# Patient Record
Sex: Male | Born: 1965 | Race: White | Hispanic: No | Marital: Married | Smoking: Former smoker
Health system: Southern US, Community
[De-identification: ages and names within clinical notes are randomized; demographics above are authoritative.]

## PROBLEM LIST (undated history)

## (undated) ENCOUNTER — Ambulatory Visit: Admission: EM | Source: Home / Self Care

## (undated) DIAGNOSIS — F419 Anxiety disorder, unspecified: Secondary | ICD-10-CM

## (undated) DIAGNOSIS — I10 Essential (primary) hypertension: Secondary | ICD-10-CM

## (undated) DIAGNOSIS — I2510 Atherosclerotic heart disease of native coronary artery without angina pectoris: Secondary | ICD-10-CM

## (undated) DIAGNOSIS — E785 Hyperlipidemia, unspecified: Secondary | ICD-10-CM

## (undated) DIAGNOSIS — K219 Gastro-esophageal reflux disease without esophagitis: Secondary | ICD-10-CM

## (undated) DIAGNOSIS — J45909 Unspecified asthma, uncomplicated: Secondary | ICD-10-CM

## (undated) DIAGNOSIS — T7840XA Allergy, unspecified, initial encounter: Secondary | ICD-10-CM

## (undated) HISTORY — DX: Anxiety disorder, unspecified: F41.9

## (undated) HISTORY — DX: Hyperlipidemia, unspecified: E78.5

## (undated) HISTORY — PX: UPPER GASTROINTESTINAL ENDOSCOPY: SHX188

## (undated) HISTORY — DX: Unspecified asthma, uncomplicated: J45.909

## (undated) HISTORY — DX: Essential (primary) hypertension: I10

## (undated) HISTORY — DX: Atherosclerotic heart disease of native coronary artery without angina pectoris: I25.10

## (undated) HISTORY — DX: Gastro-esophageal reflux disease without esophagitis: K21.9

## (undated) HISTORY — PX: INGUINAL HERNIA REPAIR: SUR1180

## (undated) HISTORY — DX: Allergy, unspecified, initial encounter: T78.40XA

---

## 2003-02-20 ENCOUNTER — Ambulatory Visit (HOSPITAL_COMMUNITY): Admission: RE | Admit: 2003-02-20 | Discharge: 2003-02-21 | Payer: Self-pay | Admitting: Cardiology

## 2003-02-20 HISTORY — PX: CORONARY STENT PLACEMENT: SHX1402

## 2004-12-26 ENCOUNTER — Ambulatory Visit: Payer: Self-pay | Admitting: Cardiology

## 2005-01-01 ENCOUNTER — Ambulatory Visit: Payer: Self-pay

## 2005-01-08 ENCOUNTER — Ambulatory Visit: Payer: Self-pay | Admitting: Cardiology

## 2007-01-06 ENCOUNTER — Ambulatory Visit: Payer: Self-pay | Admitting: Cardiology

## 2007-10-14 ENCOUNTER — Ambulatory Visit: Payer: Self-pay | Admitting: Cardiology

## 2007-10-18 ENCOUNTER — Ambulatory Visit: Payer: Self-pay

## 2009-11-07 ENCOUNTER — Emergency Department (HOSPITAL_COMMUNITY): Admission: EM | Admit: 2009-11-07 | Discharge: 2009-11-07 | Payer: Self-pay | Admitting: Emergency Medicine

## 2010-06-21 LAB — CBC
HCT: 49.9 % (ref 39.0–52.0)
Hemoglobin: 18.2 g/dL — ABNORMAL HIGH (ref 13.0–17.0)
MCH: 30.4 pg (ref 26.0–34.0)
MCHC: 36.5 g/dL — ABNORMAL HIGH (ref 30.0–36.0)
MCV: 83.3 fL (ref 78.0–100.0)
Platelets: 239 10*3/uL (ref 150–400)
RBC: 5.99 MIL/uL — ABNORMAL HIGH (ref 4.22–5.81)
RDW: 12.9 % (ref 11.5–15.5)
WBC: 8 10*3/uL (ref 4.0–10.5)

## 2010-06-21 LAB — DIFFERENTIAL
Basophils Absolute: 0.1 10*3/uL (ref 0.0–0.1)
Basophils Relative: 1 % (ref 0–1)
Eosinophils Absolute: 0.1 10*3/uL (ref 0.0–0.7)
Eosinophils Relative: 1 % (ref 0–5)
Lymphocytes Relative: 42 % (ref 12–46)
Lymphs Abs: 3.4 10*3/uL (ref 0.7–4.0)
Monocytes Absolute: 0.6 10*3/uL (ref 0.1–1.0)
Monocytes Relative: 8 % (ref 3–12)
Neutro Abs: 3.8 10*3/uL (ref 1.7–7.7)
Neutrophils Relative %: 48 % (ref 43–77)

## 2010-06-21 LAB — POCT CARDIAC MARKERS
CKMB, poc: 1.1 ng/mL (ref 1.0–8.0)
CKMB, poc: 1.6 ng/mL (ref 1.0–8.0)
Myoglobin, poc: 44.9 ng/mL (ref 12–200)
Myoglobin, poc: 87.5 ng/mL (ref 12–200)
Troponin i, poc: <0.05 ng/mL (ref 0.00–0.09)
Troponin i, poc: <0.05 ng/mL (ref 0.00–0.09)

## 2010-06-21 LAB — URINE MICROSCOPIC-ADD ON

## 2010-06-21 LAB — COMPREHENSIVE METABOLIC PANEL
ALT: 49 U/L (ref 0–53)
AST: 36 U/L (ref 0–37)
Albumin: 4.6 g/dL (ref 3.5–5.2)
Alkaline Phosphatase: 61 U/L (ref 39–117)
BUN: 12 mg/dL (ref 6–23)
CO2: 24 mEq/L (ref 19–32)
Calcium: 9.8 mg/dL (ref 8.4–10.5)
Chloride: 105 mEq/L (ref 96–112)
Creatinine, Ser: 0.82 mg/dL (ref 0.4–1.5)
GFR calc Af Amer: >60 mL/min (ref >60)
GFR calc non Af Amer: >60 mL/min (ref >60)
Glucose, Bld: 113 mg/dL — ABNORMAL HIGH (ref 70–99)
Potassium: 3.5 mEq/L (ref 3.5–5.1)
Sodium: 138 mEq/L (ref 135–145)
Total Bilirubin: 0.8 mg/dL (ref 0.3–1.2)
Total Protein: 7.2 g/dL (ref 6.0–8.3)

## 2010-06-21 LAB — POCT I-STAT 3, ART BLOOD GAS (G3+)
Acid-base deficit: 2 mmol/L (ref 0.0–2.0)
Bicarbonate: 22 mEq/L (ref 20.0–24.0)
O2 Saturation: 96 %
Patient temperature: 98.7
TCO2: 23 mmol/L (ref 0–100)
pCO2 arterial: 34 mmHg — ABNORMAL LOW (ref 35.0–45.0)
pH, Arterial: 7.42 (ref 7.350–7.450)
pO2, Arterial: 83 mmHg (ref 80.0–100.0)

## 2010-06-21 LAB — URINALYSIS, ROUTINE W REFLEX MICROSCOPIC
Bilirubin Urine: NEGATIVE
Glucose, UA: NEGATIVE mg/dL
Hgb urine dipstick: NEGATIVE
Ketones, ur: NEGATIVE mg/dL
Nitrite: NEGATIVE
Protein, ur: NEGATIVE mg/dL
Specific Gravity, Urine: 1.018 (ref 1.005–1.030)
Urobilinogen, UA: 1 mg/dL (ref 0.0–1.0)
pH: 6.5 (ref 5.0–8.0)

## 2010-06-21 LAB — D-DIMER, QUANTITATIVE: D-Dimer, Quant: <0.22 ug/mL-FEU (ref 0.00–0.48)

## 2010-06-21 LAB — BRAIN NATRIURETIC PEPTIDE: Pro B Natriuretic peptide (BNP): <30 pg/mL (ref 0.0–100.0)

## 2010-08-20 NOTE — Assessment & Plan Note (Signed)
[facility]                            CARDIOLOGY OFFICE NOTE   [facility], CELESTINE                      MRN:          [id number]  DATE:10/13/2007                            DOB:          03/31/1966    PRIMARY CARE PHYSICIAN:  Ok Edwards.   REASON FOR PRESENTATION:  Evaluate the patient with coronary artery  disease and dyspnea.   HISTORY OF PRESENT ILLNESS:  The patient is a pleasant 45 year old  gentleman with coronary artery disease as described below who presents  for followup.  He is actually not due to see me for until to December  2009.  However, he is having progressive shortness of breath.  He says  he really did notice this back when he was seeing me in October 2008,  but he did not really mention it.  It would be with exertion.  He will  get it with activity such as climbing up a flight of stairs or even  less.  He is anxious to start an exercise regimen and was concerned  about this before having this evaluated before starting it.  He has not  been having any resting shortness of breath.  Denies any PND or  orthopnea.  He has not been having any palpitations, presyncope, or  syncope.  He denies any chest pressure, neck or arm discomfort.  He is  getting his lipids followed at [facility] in the [facility].   PAST MEDICAL HISTORY:  Coronary artery disease (80-90% right coronary  artery stenosis status post Taxus stenting in November 2004, 25% LAD  stenosis, ostial 30% circumflex stenosis), well-preserved ejection  fraction, dyslipidemia, hernia repair at age 32, and ongoing tobacco use  with smokeless tobacco.   ALLERGIES AND INTOLERANCE:  PENICILLIN, Aspirin (he tolerates a low  dose), and PLAVIX caused rash.   MEDICATIONS:  1. Aspirin 162 mg daily.  2. Aciphex 20 mg daily.  3. Fish oil.  4. Triplex 135 mg daily.  5. Crestor 20 mg daily.   REVIEW OF SYSTEMS:  As stated in the HPI and otherwise negative for all  other systems.   PHYSICAL EXAMINATION:  GENERAL:  The patient is in no distress.  VITAL SIGNS:  Blood pressure 154/82, heart rate 88 and regular, weight  192 pounds, and body mass index 29.  HEENT:  Eyelids are unremarkable.  Pupils are equal, round, and  reactive.  Fundi not visualized.  Oral mucosa unremarkable.  NECK:  No jugular venous distention at 45 degrees.  Carotid upstroke  brisk and symmetrical.  No bruits.  No thyromegaly.  LYMPHATICS:  No cervical, axillary, or inguinal adenopathy.  LUNGS:  Clear to auscultation bilaterally.  BACK:  No costovertebral angle tenderness.  CHEST:  Unremarkable.  HEART:  PMI not displaced or sustained.  S1 and S2 within normal.  No  S3, no S4, no clicks, no rubs, no murmurs.  ABDOMEN:  Obese.  Positive bowel sounds normal in frequency and pitch.  No bruits, no rebound, no guarding, no midline pulsatile mass.  No  hepatomegaly.  No splenomegaly.  SKIN:  No rashes.  No nodules.  EXTREMITIES:  2+ pulses throughout.  No edema, no cyanosis, no clubbing.  NEURO:  Oriented to person, place, and time.  Cranial nerves II-XII  grossly intact.  Motor grossly intact.   EKG, sinus rhythm, rate 89, axis leftward, incomplete right bundle-  branch block, no acute ST-T wave changes, no change from previous EKGs.   ASSESSMENT/PLAN:  1. Shortness of breath.  The patient is having some dyspnea with      exertion.  Given his previous coronary artery disease and this      symptom which is somewhat new, the pretest probability of      obstructive coronary artery disease is somewhat moderate.      Therefore, he needs screening with perfusion study.  He would be      able to walk on a treadmill, so he will have an exercise      Cardiolite.  Provided this is negative, he can get on with an      exercise regimen and I will give him instruction on this.  He will      continue with aggressive secondary risk reduction.  2. Dyslipidemia.  He is followed in the [facility]id  Clinic in [facility] and he will continue with this with a goal LDL less than      100 and an HDL greater than 40.  3. Tobacco.  He is encouraged to stop using all tobacco products      including smokeless tobacco.  4. Weight.  The patient's body mass index puts him near and obese      range.  He and I have discussed weight loss.  He is committed to      exercise and hopefully will get down to a healthier weight.  5. Followup.  I will see him back in 6 months or sooner if needed.     Rollene Rotunda, MD, [facility]  Electronically Signed    JH/MedQ  DD: 10/13/2007  DT: 10/14/2007  Job #: [id number]   cc:   Lindaann Pascal, PA

## 2010-08-20 NOTE — Assessment & Plan Note (Signed)
[facility]                            CARDIOLOGY OFFICE NOTE   [facility], GUT                      MRN:          [id number]  DATE:01/06/2007                            DOB:          05/07/1965    PRIMARY CARE PHYSICIAN:  Lorin Picket Long.   REASON FOR PRESENTATION:  Evaluate patient with coronary disease.   HISTORY OF PRESENT ILLNESS:  The patient presents for followup. It has  been almost 2 years. He has a history of coronary disease as described  below. For a while he participated in risk reduction. However, he has  not been doing this very religiously over the last few years. He used to  walk everyday. He stopped doing this sometime ago. He still does some  activities but it is fairly minimal. With his activities of daily living  he does not get any chest discomfort, neck, or arm discomfort. He does  not get any palpitations, pre syncope, or syncope. He has no PND or  orthopnea. He has dyslipidemia and has been taking medications but has  an abnormal lipomed profile with a high level of small LDL.   PAST MEDICAL HISTORY:  Coronary artery disease (80% to 90% right  coronary artery stenosis status post TAXUS stenting in November 2004,  25% LAD stenosis, ostial 30% circumflex stenosis), well preserved  ejection fraction, dyslipidemia, hernia repair at age 21, ongoing tobacco  use (snuff).   ALLERGIES:  PENICILLIN, ASPIRIN, (HE TOLERATES A LOW DOSE ASPIRIN),  PLAVIX CAUSED A RASH.   MEDICATIONS:  1. Aspirin 162 mg daily.  2. Singulair 10 mg daily.  3. Prilosec 20 mg b.i.d.  4. Lexapro 10 mg daily.  5. Lisinopril 10 mg daily.  6. Crestor 10 mg daily.   REVIEW OF SYSTEMS:  As stated in the HPI. Positive for fatigue. However,  he wakes up at 4 a.m. to get to work. He says that some days he feels  relatively rested. This has been a chronic problem and not new.   PHYSICAL EXAMINATION:  The patient is in no distress, pleasant. Blood  pressure  140/84, heart rate 72 and regular, weight 201 pounds, body mass  index 30.  HEENT: Eyelids unremarkable, pupils equal, round, and reactive to light,  fundi not visualized, oral mucosa unremarkable.  NECK: No jugular venous distension at 45 degrees, carotid upstroke brisk  and symmetric. No bruits or thyromegaly.  LYMPHATICS: No cervical, axillary, inguinal adenopathy.  LUNGS: Clear to auscultation  bilaterally.  BACK: No costovertebral angle tenderness.  CHEST: Unremarkable.  HEART: PMI not displaced or sustained, S1 and S2 within normal limits.  No S3. No S4, clicks, rubs, or murmurs.  ABDOMEN: Obese, positive bowel sounds normal to frequency and pitch. No  bruits, rebound, guarding. No midline pulsatile mass. No hepatomegaly or  splenomegaly.  SKIN: No rashes. No nodules.  EXTREMITIES: 2+ pulses throughout. No edema. No cyanosis. No clubbing.  NEURO: Oriented to person, place, and time. Cranial nerves II-XII  grossly intact. Motor grossly intact.   EKG: Sinus rhythm, rate 56, axis within normal limits, intervals within  normal limits, no  acute ST-T wave changes.   ASSESSMENT/PLAN:  1. Coronary disease, the patient had a stress perfusion study in 2006.      He has had no change in his symptoms since then. At this point no      further cardiovascular testing is suggested. However, I did      encourage him to participate more aggressively in secondary risk      reduction.  2. Obesity, he understands the need to lose weight with exercise and      diet. I advised him on this and asked him to get back to his      walking regimen. Current recommendations are 5 to 7 days per week      from 30 minutes to 1 hour of moderate level intensity aerobic      exercise with light weight toning.  3. Tobacco, he understands that there is probably some risk to      smokeless tobacco and he should come off this completely as well.  4. Dyslipidemia, I reviewed his lipid profile at length with him. I  do      encourage him to follow up with the Pharmacist in the [facility]      of [place] for adjustment. His LDL is slightly elevated      and of the wrong size. He has had a better profile in the past. His      HDL should be above 40.  5. Follow up, we will see him back in 12 months.     Rollene Rotunda, MD, [facility]  Electronically Signed    JH/MedQ  DD: 01/06/2007  DT: 01/07/2007  Job #: [id number]   cc:   Lindaann Pascal

## 2010-08-23 NOTE — Cardiovascular Report (Signed)
   NAME:  MARE, LUDTKE NO.:  [id number]   MEDICAL RECORD NO.:  [id number]                   PATIENT TYPE:  OIB   LOCATION:  2866                                 FACILITY:  [facility]   PHYSICIAN:  Charlies Constable, M.D.                  DATE OF BIRTH:  [birth year]   DATE OF PROCEDURE:  02/20/2003  DATE OF DISCHARGE:                              CARDIAC CATHETERIZATION   CLINICAL HISTORY:  Mr. Tully is 45 years old and was recently seen by  Rollene Rotunda, M.D. for evaluation of chest pain.  He had a Cardiolite scan  which showed inferior ischemia and was studied immediately prior to this  intervention by Rollene Rotunda, M.D. and found to have a tight lesion in the  proximal right coronary artery.   PROCEDURE:  The procedure was performed via the right femoral artery through  a 6-French sheath.  We used a 6-French JR4 guiding catheter and a PT2 light  support wire.  The patient was given heparin to prolong the ACT to close to  300 seconds and was given 300 mg of Plavix.  He is allergic to aspirin.  We  were able to cross the lesion in the proximal right coronary artery with the  wire without difficulty.  We direct stented with a 3.5 x 6 mm Taxus stent  deploying this with one inflation up to 18 atmospheres for 30 seconds.  We  then post dilated with a 4.0 x 15 mm Maverick balloon performing one  inflation up to 12 atmospheres for 30 seconds.  Repeat diagnostic study was  then performed through the guiding catheter.  The patient tolerated the  procedure well and left the laboratory in satisfactory condition.   RESULTS:  Initially, the stenosis in the right coronary artery was located  at a moderate bend and was estimated at about 80%.  Following stenting this  improved to less than 10%.  Initially, we thought there might be a small  edge tear at the proximal edge of the stent but on further review we think  this was probably related to a vessel takeoff.   CONCLUSION:  Successful stenting of the lesion in the proximal right  coronary artery with a drug-eluting stent with improvement in center of  narrowing from 80% to less than 10%.   DISPOSITION:  The patient returned to postanesthesia care unit for further  observation.                                               Charlies Constable, M.D.    BB/MEDQ  D:  02/20/2003  T:  02/20/2003  Job:  [id number]   cc:   Montey Hora, P.A.  [facility]   CP Lab

## 2010-08-23 NOTE — Discharge Summary (Signed)
NAME:  Jason, [facility] NO.:  [id number]   MEDICAL RECORD NO.:  [id number]                   PATIENT TYPE:  OIB   LOCATION:  6533                                 FACILITY:  [facility]   PHYSICIAN:  Rollene Rotunda, M.D.                DATE OF BIRTH:  11-23-1965   DATE OF ADMISSION:  02/20/2003  DATE OF DISCHARGE:  02/21/2003                                 DISCHARGE SUMMARY   HOSPITAL COURSE:  [facility] is a 45 year old male patient who was sent for  primary evaluation on January 11, 2003, with chest pain and left arm  discomfort associated with shortness of breath.  Ultimately, he had a  Cardiolite scan which showed inferior ischemia and cardiac catheterization  was planned on  February 20, 2003. The catheterization revealed the  following: Left main angiographically, LAD 25% at D-1, first diagonal had an  ostial 30% lesion, circumflex angiographically normal, right coronary artery  had a proximal 90% lesion, and EF was normal at 69%.   At this point Dr. Charlies Constable intervened on the right coronary artery  lesion reducing the lesion to a less than 10% lesion post procedure. The  patient remained in the hospital overnight and was ready for discharge home  the following day.   Lab studies during patient's stay included BUN of 14, creatinine 0.8, sodium  140, potassium 4.1, hemoglobin 17.1, hematocrit 49.7, platelet count  271,000, and white count 6.6.  His EKG revealed a normal sinus rhythm with a  rate of 64 with no acute ST-T wave changes.   DISCHARGE MEDICATIONS:  Medications on discharge include Lopid 600 mg p.o.  b.i.d., fish oil, Protonix, Allegra, _______, Singulair, Plavix 75 mg one a  day (I have given him enough for six months total), sublingual nitroglycerin  p.r.n. chest pain, May use Tylenol one to two tablets q.6h. p.r.n. pain.   DISCHARGE INSTRUCTIONS:  No strenuous activity for two days and then  gradually increase activity.  He is on a  low-fat diet.  Clean the cath site  with soap and water, no scrubbing. He is to call for any questions or  concerns. He is to follow up at the [facility] office on March 08, 2003, at  11:15 a.m.      [facility], P.A. [facility]                      Rollene Rotunda, M.D.    LB/MEDQ  D:  02/21/2003  T:  02/21/2003  Job:  [id number]   cc:   Mr. Jason Chick, PA  [facility]   Rollene Rotunda, M.D.

## 2010-08-23 NOTE — Cardiovascular Report (Signed)
   NAME:  [facility], [facility] NO.:  [id number]   MEDICAL RECORD NO.:  [id number]                   PATIENT TYPE:  OIB   LOCATION:  2866                                 FACILITY:  [facility]   PHYSICIAN:  Rollene Rotunda, M.D.                DATE OF BIRTH:  January 02, 1966   DATE OF PROCEDURE:  02/20/2003  DATE OF DISCHARGE:                              CARDIAC CATHETERIZATION   PRIMARY:  Montey Hora, P.A., [facility]   PROCEDURE:  1. Left heart catheterization.  2. Coronary arteriography.   INDICATIONS:  Evaluate patient with chest pain and a Cardiolite suggesting  possibility of inferior ischemia.   PROCEDURAL NOTE:  Left heart catheterization is performed via the right  femoral artery.  The artery was cannulated using anterior wall puncture.  A  #6-French arterial sheath was inserted via the modified Seldinger technique.  Preformed Judkins and a pigtail catheter were utilized.  The patient  tolerated the procedure well and left the laboratory in stable condition.   RESULTS:  HEMODYNAMICS:  LV 129/6, AO 130/87.   CORONARIES:  The left main was normal.  The LAD had a 25% stenosis at a first diagonal.  There was 25% stenosis of the mid to distal vessel.  There was a large first  diagonal which had ostial 30% stenosis.  The circumflex had a very small  vessel in the AV groove which was normal.  There was a large obtuse marginal  which was normal.  The right coronary artery was a large, dominant vessel.  There was proximal 90% stenosis.   LEFT VENTRICULOGRAM:  The left ventriculogram was obtained in the RAO  projection.  The EF was 65% with normal wall motion.   CONCLUSION:  High grade single vessel coronary artery disease.   PLAN:  The patient will have percutaneous revascularization per Charlies Constable, M.D. and aggressive secondary risk reduction.                                               Rollene Rotunda, M.D.    JH/MEDQ  D:   02/20/2003  T:  02/20/2003  Job:  [id number]   cc:   Montey Hora, PA

## 2012-08-16 ENCOUNTER — Other Ambulatory Visit: Payer: Self-pay | Admitting: *Deleted

## 2012-08-16 MED ORDER — ROSUVASTATIN CALCIUM 40 MG PO TABS: 40.0000 mg | ORAL_TABLET | Freq: Every day | ORAL | Status: DC

## 2012-08-16 MED ORDER — OMEPRAZOLE 20 MG PO CPDR: 20.0000 mg | DELAYED_RELEASE_CAPSULE | Freq: Two times a day (BID) | ORAL | Status: DC

## 2012-08-16 NOTE — Telephone Encounter (Signed)
Patient last seen in office and had labs done on 04-20-12. Please advise. Thank you

## 2012-08-18 ENCOUNTER — Other Ambulatory Visit: Payer: Self-pay

## 2012-08-18 MED ORDER — NITROGLYCERIN 0.4 MG SL SUBL: 0.4000 mg | SUBLINGUAL_TABLET | SUBLINGUAL | Status: DC | PRN

## 2012-08-23 ENCOUNTER — Other Ambulatory Visit: Payer: Self-pay

## 2012-08-23 MED ORDER — METOPROLOL TARTRATE 25 MG PO TABS: ORAL_TABLET | ORAL | Status: DC

## 2012-10-11 ENCOUNTER — Ambulatory Visit (INDEPENDENT_AMBULATORY_CARE_PROVIDER_SITE_OTHER): Payer: PRIVATE HEALTH INSURANCE | Admitting: Family Medicine

## 2012-10-11 ENCOUNTER — Encounter: Payer: Self-pay | Admitting: Family Medicine

## 2012-10-11 VITALS — BP 154/90 | HR 88 | Temp 98.3°F | Wt 212.0 lb

## 2012-10-11 DIAGNOSIS — K219 Gastro-esophageal reflux disease without esophagitis: Secondary | ICD-10-CM

## 2012-10-11 DIAGNOSIS — I10 Essential (primary) hypertension: Secondary | ICD-10-CM

## 2012-10-11 DIAGNOSIS — I25810 Atherosclerosis of coronary artery bypass graft(s) without angina pectoris: Secondary | ICD-10-CM

## 2012-10-11 MED ORDER — RANITIDINE HCL 150 MG PO TABS: 150.0000 mg | ORAL_TABLET | Freq: Two times a day (BID) | ORAL | Status: DC

## 2012-10-11 NOTE — Patient Instructions (Signed)
Gastroesophageal Reflux Disease, Adult  Gastroesophageal reflux disease (GERD) happens when acid from your stomach flows up into the esophagus. When acid comes in contact with the esophagus, the acid causes soreness (inflammation) in the esophagus. Over time, GERD may create small holes (ulcers) in the lining of the esophagus.  CAUSES   · Increased body weight. This puts pressure on the stomach, making acid rise from the stomach into the esophagus.  · Smoking. This increases acid production in the stomach.  · Drinking alcohol. This causes decreased pressure in the lower esophageal sphincter (valve or ring of muscle between the esophagus and stomach), allowing acid from the stomach into the esophagus.  · Late evening meals and a full stomach. This increases pressure and acid production in the stomach.  · A malformed lower esophageal sphincter.  Sometimes, no cause is found.  SYMPTOMS   · Burning pain in the lower part of the mid-chest behind the breastbone and in the mid-stomach area. This may occur twice a week or more often.  · Trouble swallowing.  · Sore throat.  · Dry cough.  · Asthma-like symptoms including chest tightness, shortness of breath, or wheezing.  DIAGNOSIS   Your caregiver may be able to diagnose GERD based on your symptoms. In some cases, X-rays and other tests may be done to check for complications or to check the condition of your stomach and esophagus.  TREATMENT   Your caregiver may recommend over-the-counter or prescription medicines to help decrease acid production. Ask your caregiver before starting or adding any new medicines.   HOME CARE INSTRUCTIONS   · Change the factors that you can control. Ask your caregiver for guidance concerning weight loss, quitting smoking, and alcohol consumption.  · Avoid foods and drinks that make your symptoms worse, such as:  · Caffeine or alcoholic drinks.  · Chocolate.  · Peppermint or mint flavorings.  · Garlic and onions.  · Spicy foods.  · Citrus fruits,  such as oranges, lemons, or limes.  · Tomato-based foods such as sauce, chili, salsa, and pizza.  · Fried and fatty foods.  · Avoid lying down for the 3 hours prior to your bedtime or prior to taking a nap.  · Eat small, frequent meals instead of large meals.  · Wear loose-fitting clothing. Do not wear anything tight around your waist that causes pressure on your stomach.  · Raise the head of your bed 6 to 8 inches with wood blocks to help you sleep. Extra pillows will not help.  · Only take over-the-counter or prescription medicines for pain, discomfort, or fever as directed by your caregiver.  · Do not take aspirin, ibuprofen, or other nonsteroidal anti-inflammatory drugs (NSAIDs).  SEEK IMMEDIATE MEDICAL CARE IF:   · You have pain in your arms, neck, jaw, teeth, or back.  · Your pain increases or changes in intensity or duration.  · You develop nausea, vomiting, or sweating (diaphoresis).  · You develop shortness of breath, or you faint.  · Your vomit is green, yellow, black, or looks like coffee grounds or blood.  · Your stool is red, bloody, or black.  These symptoms could be signs of other problems, such as heart disease, gastric bleeding, or esophageal bleeding.  MAKE SURE YOU:   · Understand these instructions.  · Will watch your condition.  · Will get help right away if you are not doing well or get worse.  Document Released: 01/01/2005 Document Revised: 06/16/2011 Document Reviewed: 10/11/2010  [employer]® Patient   Information ©2014 [employer].

## 2012-10-11 NOTE — Progress Notes (Signed)
  Subjective:    Patient ID: [facility], male    DOB: November 15, 1965, 47 y.o.   MRN: [id number]  HPI This 47 y.o. male presents for evaluation of GERD.  He has hx of hiatel hernia and states he has been choked up a couple times when he was eating chocolate M&M and a piece of candy.  He states he has been taking omeprazole 20mg  po bid and he is getting some breakthrough sx's.  He has been on other PPI's and states that none work as well as omeprazole.  He has hx of CAD and coronary artery stent and sees Dr. Emeline Darling cardiology and is over due for an appointment.   Review of Systems C/o dysphasia and GERD. No chest pain, SOB, HA, dizziness, vision change, N/V, diarrhea, constipation, dysuria, urinary urgency or frequency, myalgias, arthralgias or rash.     Objective:   Physical Exam Vital signs noted  Well developed well nourished male.  HEENT - Head atraumatic Normocephalic                Eyes - PERRLA, Conjuctiva - clear Sclera- Clear EOMI                Ears - EAC's Wnl TM's Wnl Gross Hearing WNL                Nose - Nares patent                 Throat - oropharanx wnl Respiratory - Lungs CTA bilateral Cardiac - RRR S1 and S2 without murmur GI - Abdomen soft Nontender and bowel sounds active x 4 Extremities - No edema. Neuro - Grossly intact.       Assessment & Plan:  GERD (gastroesophageal reflux disease) - Plan: ranitidine (ZANTAC) 150 MG tablet bid and if still having dysphasia or GERD then will need referral to GI.  CAD (coronary artery disease) of artery bypass graft - Plan: Ambulatory referral to Cardiology.  He states he is due for an appointment with Dr. Emeline Darling Cardiology.

## 2012-10-25 ENCOUNTER — Telehealth: Payer: Self-pay | Admitting: Family Medicine

## 2012-10-25 ENCOUNTER — Ambulatory Visit (INDEPENDENT_AMBULATORY_CARE_PROVIDER_SITE_OTHER): Payer: PRIVATE HEALTH INSURANCE | Admitting: Family Medicine

## 2012-10-25 ENCOUNTER — Encounter: Payer: Self-pay | Admitting: Family Medicine

## 2012-10-25 VITALS — BP 145/85 | HR 66 | Temp 97.9°F | Wt 209.8 lb

## 2012-10-25 DIAGNOSIS — R4702 Dysphasia: Secondary | ICD-10-CM

## 2012-10-25 DIAGNOSIS — R4789 Other speech disturbances: Secondary | ICD-10-CM

## 2012-10-25 DIAGNOSIS — J441 Chronic obstructive pulmonary disease with (acute) exacerbation: Secondary | ICD-10-CM

## 2012-10-25 DIAGNOSIS — F411 Generalized anxiety disorder: Secondary | ICD-10-CM

## 2012-10-25 DIAGNOSIS — K219 Gastro-esophageal reflux disease without esophagitis: Secondary | ICD-10-CM

## 2012-10-25 MED ORDER — DEXLANSOPRAZOLE 60 MG PO CPDR: 60.0000 mg | DELAYED_RELEASE_CAPSULE | Freq: Every day | ORAL | Status: DC

## 2012-10-25 MED ORDER — BUDESONIDE-FORMOTEROL FUMARATE 160-4.5 MCG/ACT IN AERO: 2.0000 | INHALATION_SPRAY | Freq: Two times a day (BID) | RESPIRATORY_TRACT | Status: DC

## 2012-10-25 MED ORDER — ALPRAZOLAM 0.5 MG PO TABS: 0.5000 mg | ORAL_TABLET | Freq: Every evening | ORAL | Status: DC | PRN

## 2012-10-25 NOTE — Telephone Encounter (Signed)
appt given with bill oxford at 2:30

## 2012-10-25 NOTE — Progress Notes (Signed)
  Subjective:    Patient ID: [facility], male    DOB: [birth year], 47 y.o.   MRN: [id number]  HPI This 47 y.o. male presents for evaluation of GERD sx's he has been experiencing over the last few weeks. He has hx of GERD and was seen a few weeks ago c/o choking and waking up night gasping for breath and  Having difficulties with GERD sx's.  He has hx of hiatel hernia.  He has hx of CAD and has coronary artery stent. He is awaiting cardiology appointment.  He is accompanied by his wife and she states he has not been able to sleep and  Has been very anxious.  Patient states he worries about swallowing because sometimes the food or fluids do not Go down easy and he gets choked up.  He also c/o shortness of breath.  He states that ever since he has taken the Beta blocker for his CAD he has had SOB problems.  He used to be heavy smoker for years an quit a few years ago.   Review of Systems C/o dysphasia, insomnia, choking, SOB, and anxiety No chest pain, SOB, HA, dizziness, vision change, N/V, diarrhea, constipation, dysuria, urinary urgency or frequency, myalgias, arthralgias or rash.     Objective:   Physical Exam Vital signs noted  Well developed well nourished male.  HEENT - Head atraumatic Normocephalic                Eyes - PERRLA, Conjuctiva - clear Sclera- Clear EOMI                Ears - EAC's Wnl TM's Wnl Gross Hearing WNL                Nose - Nares patent                 Throat - oropharanx wnl Respiratory - Lungs CTA bilateral Cardiac - RRR S1 and S2 without murmur GI - Abdomen soft Nontender and bowel sounds active x 4 Extremities - No edema. Neuro - Grossly intact.       Assessment & Plan:  Dysphasia - Plan: Ambulatory referral to Gastroenterology, discussed DC spicy food and will recommend dexilant.  Anxiety state, unspecified - Plan: ALPRAZolam (XANAX) 0.5 MG tablet, take xanax for his insomnia and anxiety  COPD exacerbation - Plan: budesonide-formoterol  (SYMBICORT) 160-4.5 MCG/ACT inhaler, try inhaler and see if helps  GERD (gastroesophageal reflux disease) - Plan: dexlansoprazole (DEXILANT) 60 MG capsule Follow up in 2 weeks.

## 2012-10-25 NOTE — Patient Instructions (Signed)

## 2012-10-26 ENCOUNTER — Encounter: Payer: Self-pay | Admitting: Gastroenterology

## 2012-10-27 ENCOUNTER — Other Ambulatory Visit: Payer: Self-pay

## 2012-10-27 MED ORDER — METOPROLOL TARTRATE 25 MG PO TABS: ORAL_TABLET | ORAL | Status: DC

## 2012-10-27 NOTE — Telephone Encounter (Signed)
Last seen 04/20/12  MMM 

## 2012-11-08 ENCOUNTER — Ambulatory Visit: Payer: PRIVATE HEALTH INSURANCE | Admitting: Family Medicine

## 2012-11-11 ENCOUNTER — Encounter: Payer: Self-pay | Admitting: Family Medicine

## 2012-11-11 ENCOUNTER — Ambulatory Visit (INDEPENDENT_AMBULATORY_CARE_PROVIDER_SITE_OTHER): Payer: PRIVATE HEALTH INSURANCE | Admitting: Family Medicine

## 2012-11-11 VITALS — BP 132/86 | HR 78 | Ht 67.5 in | Wt 209.0 lb

## 2012-11-11 DIAGNOSIS — Z0000 Encounter for general adult medical examination without abnormal findings: Secondary | ICD-10-CM

## 2012-11-11 DIAGNOSIS — R4702 Dysphasia: Secondary | ICD-10-CM | POA: Insufficient documentation

## 2012-11-11 DIAGNOSIS — R05 Cough: Secondary | ICD-10-CM

## 2012-11-11 DIAGNOSIS — R4789 Other speech disturbances: Secondary | ICD-10-CM

## 2012-11-11 NOTE — Patient Instructions (Signed)

## 2012-11-11 NOTE — Progress Notes (Signed)
  Subjective:    Patient ID: Jason Braun, male    DOB: 1965-12-15, 47 y.o.   MRN: [id number]  HPI This 47 y.o. male presents for evaluation of dysphasia and GERD sx's.  He was seen 2 weeks ago and was having some coughing, congestion, GERD, and dysphasia. He has been taking dexilant for a week and then went to taking nexium otc.  He has  Been using a symbicort inhaler one puff bid and states he is feeling better.  He was Referred to GI for swallowing problems.  He states his swallowing is better since Doing the dexilant and nexium but he still gets choked up.   Review of Systems No chest pain, SOB, HA, dizziness, vision change, N/V, diarrhea, constipation, dysuria, urinary urgency or frequency, myalgias, arthralgias or rash.     Objective:   Physical Exam Vital signs noted  Well developed well nourished male.  HEENT - Head atraumatic Normocephalic                Eyes - PERRLA, Conjuctiva - clear Sclera- Clear EOMI                Ears - EAC's Wnl TM's Wnl Gross Hearing WNL                Nose - Nares patent                 Throat - oropharanx wnl Respiratory - Lungs CTA bilateral Cardiac - RRR S1 and S2 without murmur GI - Abdomen soft Nontender and bowel sounds active x 4 Extremities - No edema. Neuro - Grossly intact.       Assessment & Plan:  Dysphasia - Follow up with GI and continue with Nexium otc.  Cough - Dymista nasal spray one spray per nostril bid #1 sample, symbicort 160/4.5 2 puffs bid #1 sample. Get CPE labs this week fasting.  Follow up in 6 months.

## 2012-11-16 ENCOUNTER — Telehealth: Payer: Self-pay | Admitting: Family Medicine

## 2012-11-17 ENCOUNTER — Ambulatory Visit (INDEPENDENT_AMBULATORY_CARE_PROVIDER_SITE_OTHER): Payer: PRIVATE HEALTH INSURANCE | Admitting: Gastroenterology

## 2012-11-17 ENCOUNTER — Encounter: Payer: Self-pay | Admitting: Gastroenterology

## 2012-11-17 VITALS — BP 128/80 | HR 80 | Ht 66.75 in | Wt 207.2 lb

## 2012-11-17 DIAGNOSIS — R1319 Other dysphagia: Secondary | ICD-10-CM

## 2012-11-17 DIAGNOSIS — K219 Gastro-esophageal reflux disease without esophagitis: Secondary | ICD-10-CM

## 2012-11-17 NOTE — Progress Notes (Signed)
History of Present Illness: This is a 47 year old male with chronic GERD, worsening over the past several weeks. Frequent regurgitation and belching. Occasionally chokes on water and has choked on solid food. Occasional nighttime regurgitation. Changed from omeprazole 20 mg to Nexium 20 mg recently and his symptoms have improved. Denies weight loss, abdominal pain, constipation, diarrhea, change in stool caliber, melena, hematochezia, nausea, vomiting, chest pain.  Review of Systems: Pertinent positive and negative review of systems were noted in the above HPI section. All other review of systems were otherwise negative.  Current Medications, Allergies, Past Medical History, Past Surgical History, Family History and Social History were reviewed in [employer] record.  Physical Exam: General: Well developed , well nourished, no acute distress Head: Normocephalic and atraumatic Eyes:  sclerae anicteric, EOMI Ears: Normal auditory acuity Mouth: No deformity or lesions Neck: Supple, no masses or thyromegaly Lungs: Clear throughout to auscultation Heart: Regular rate and rhythm; no murmurs, rubs or bruits Abdomen: Soft, non tender and non distended. No masses, hepatosplenomegaly or hernias noted. Normal Bowel sounds Musculoskeletal: Symmetrical with no gross deformities  Skin: No lesions on visible extremities Pulses:  Normal pulses noted Extremities: No clubbing, cyanosis, edema or deformities noted Neurological: Alert oriented x 4, grossly nonfocal Cervical Nodes:  No significant cervical adenopathy Inguinal Nodes: No significant inguinal adenopathy Psychological:  Alert and cooperative. Normal mood and affect  Assessment and Recommendations:  1. GERD and dysphagia. R/O esophagitis, stricture, etc. Schedule EGD. The risks, benefits, and alternatives to endoscopy with possible biopsy and possible dilation were discussed with the patient and they consent to proceed.

## 2012-11-17 NOTE — Patient Instructions (Addendum)
You have been scheduled for an endoscopy with propofol. Please follow written instructions given to you at your visit today. If you use inhalers (even only as needed), please bring them with you on the day of your procedure. Your physician has requested that you go to www.startemmi.com and enter the access code given to you at your visit today. This web site gives a general overview about your procedure. However, you should still follow specific instructions given to you by our office regarding your preparation for the procedure.  Increase your over the counter Nexium to one tablet by mouth twice daily.   Patient advised to avoid spicy, acidic, citrus, chocolate, mints, fruit and fruit juices.  Limit the intake of caffeine, alcohol and Soda.  Don't exercise too soon after eating.  Don't lie down within 3-4 hours of eating.  Elevate the head of your bed.  Thank you for choosing me and [facility].  Venita Lick. Pleas Koch., MD., [facility]

## 2012-11-19 ENCOUNTER — Telehealth: Payer: Self-pay | Admitting: Family Medicine

## 2012-11-19 ENCOUNTER — Other Ambulatory Visit: Payer: Self-pay | Admitting: Family Medicine

## 2012-11-19 MED ORDER — AZELASTINE HCL 0.1 % NA SOLN: 1.0000 | Freq: Two times a day (BID) | NASAL | Status: DC

## 2012-11-19 NOTE — Telephone Encounter (Signed)
The Dymista you prescribed is $150, the pharmacist said they could use flonase OTC and Astelin together to equal the Dymista, needs RX for Astelin sent to pharmacy. Notify pt when this is done

## 2012-11-19 NOTE — Telephone Encounter (Signed)
astelin rx sent in

## 2012-11-19 NOTE — Telephone Encounter (Signed)
Pt aware.

## 2012-11-23 ENCOUNTER — Encounter: Payer: Self-pay | Admitting: Gastroenterology

## 2012-11-23 NOTE — Telephone Encounter (Signed)
CALLED PT BECAUSE NO RECORD OF TAKING FLONASE IN CHART. PT SAID HE HASNT HAD A RX FOR FLONASE BUT WANTED TO TRY IT. PT DECIDED TODAY HE DIDN'T NEED RX.

## 2012-11-25 ENCOUNTER — Ambulatory Visit (AMBULATORY_SURGERY_CENTER): Payer: PRIVATE HEALTH INSURANCE | Admitting: Gastroenterology

## 2012-11-25 ENCOUNTER — Encounter: Payer: Self-pay | Admitting: Gastroenterology

## 2012-11-25 VITALS — BP 123/79 | HR 67 | Temp 97.9°F | Resp 53 | Ht 66.0 in | Wt 207.0 lb

## 2012-11-25 DIAGNOSIS — K2970 Gastritis, unspecified, without bleeding: Secondary | ICD-10-CM

## 2012-11-25 DIAGNOSIS — K2990 Gastroduodenitis, unspecified, without bleeding: Secondary | ICD-10-CM

## 2012-11-25 DIAGNOSIS — K219 Gastro-esophageal reflux disease without esophagitis: Secondary | ICD-10-CM

## 2012-11-25 DIAGNOSIS — R4702 Dysphasia: Secondary | ICD-10-CM

## 2012-11-25 DIAGNOSIS — R1319 Other dysphagia: Secondary | ICD-10-CM

## 2012-11-25 DIAGNOSIS — R4789 Other speech disturbances: Secondary | ICD-10-CM

## 2012-11-25 MED ORDER — SODIUM CHLORIDE 0.9 % IV SOLN: 500.0000 mL | INTRAVENOUS | Status: DC

## 2012-11-25 NOTE — Progress Notes (Signed)
Report to pacu rn, vvs, bbs=clear

## 2012-11-25 NOTE — Progress Notes (Signed)
Called to room to assist during endoscopic procedure.  Patient ID and intended procedure confirmed with present staff. Received instructions for my participation in the procedure from the performing physician.  

## 2012-11-25 NOTE — Op Note (Signed)
[facility] [address] [place], [address]   ENDOSCOPY PROCEDURE REPORT  PATIENT: Jason, Braun  MR#: [id number] BIRTHDATE: 12-21-1965 , 47  yrs. old GENDER: Male ENDOSCOPIST: Meryl Dare, MD, [facility] REFERRED BY:  Rudi Heap, M.D. PROCEDURE DATE:  11/25/2012 PROCEDURE:  EGD w/ biopsy and Savary dilation of esophagus ASA CLASS:     Class II INDICATIONS:  Dysphagia.   History of esophageal reflux. MEDICATIONS: MAC sedation, administered by CRNA and propofol (Diprivan) 150mg  IV TOPICAL ANESTHETIC: none DESCRIPTION OF PROCEDURE: After the risks benefits and alternatives of the procedure were thoroughly explained, informed consent was obtained.  The LB [id number] endoscope was introduced through the mouth and advanced to the second portion of the duodenum  without limitations.  The instrument was slowly withdrawn as the mucosa was fully examined.  STOMACH: Mild gastritis was found in the gastric antrum.  Multiple biopsies were performed. The stomach otherwise appeared normal. ESOPHAGUS: The mucosa of the esophagus appeared normal. DUODENUM: The duodenal mucosa showed no abnormalities in the bulb and second portion of the duodenum.  Retroflexed views revealed no abnormalities.  A guidewire was placed and the scope was then withdrawn from the patient. A 17 mm Savary dilator was passed with no resistance and no heme for dysphagia without a stricture. The guidewire and dilator were removed from the patient and the procedure completed.  COMPLICATIONS: There were no complications.  ENDOSCOPIC IMPRESSION: 1.   Gastritis in the gastric antrum; multiple biopsies 2.   The EGD otherwise appeared normal  RECOMMENDATIONS: 1.  Anti-reflux regimen long term 2.  Continue PPI long term 3.  Await pathology results 4.  Post dilation instructions   eSigned:  Meryl Dare, MD, [facility] 11/25/2012 11:25 AM

## 2012-11-25 NOTE — Patient Instructions (Addendum)
FOLLOW DISCHARGE INSTRUCTIONS (BLUE AND GREEN SHEETS).YOU HAD AN ENDOSCOPIC PROCEDURE TODAY AT THE [facility]: Refer to the procedure report that was given to you for any specific questions about what was found during the examination.  If the procedure report does not answer your questions, please call your gastroenterologist to clarify.  If you requested that your care partner not be given the details of your procedure findings, then the procedure report has been included in a sealed envelope for you to review at your convenience later.  YOU SHOULD EXPECT: Some feelings of bloating in the abdomen. Passage of more gas than usual.  Walking can help get rid of the air that was put into your GI tract during the procedure and reduce the bloating. If you had a lower endoscopy (such as a colonoscopy or flexible sigmoidoscopy) you may notice spotting of blood in your stool or on the toilet paper. If you underwent a bowel prep for your procedure, then you may not have a normal bowel movement for a few days.  DIET: Your first meal following the procedure should be a light meal and then it is ok to progress to your normal diet.  A half-sandwich or bowl of soup is an example of a good first meal.  Heavy or fried foods are harder to digest and may make you feel nauseous or bloated.  Likewise meals heavy in dairy and vegetables can cause extra gas to form and this can also increase the bloating.  Drink plenty of fluids but you should avoid alcoholic beverages for 24 hours.  ACTIVITY: Your care partner should take you home directly after the procedure.  You should plan to take it easy, moving slowly for the rest of the day.  You can resume normal activity the day after the procedure however you should NOT DRIVE or use heavy machinery for 24 hours (because of the sedation medicines used during the test).    SYMPTOMS TO REPORT IMMEDIATELY: A gastroenterologist can be reached at any hour.  During normal  business hours, 8:30 AM to 5:00 PM Monday through Friday, call [phone].  After hours and on weekends, please call the GI answering service at [phone] who will take a message and have the physician on call contact you.    Following upper endoscopy (EGD)  Vomiting of blood or coffee ground material  New chest pain or pain under the shoulder blades  Painful or persistently difficult swallowing  New shortness of breath  Fever of 100F or higher  Black, tarry-looking stools  FOLLOW UP: If any biopsies were taken you will be contacted by phone or by letter within the next 1-3 weeks.  Call your gastroenterologist if you have not heard about the biopsies in 3 weeks.  Our staff will call the home number listed on your records the next business day following your procedure to check on you and address any questions or concerns that you may have at that time regarding the information given to you following your procedure. This is a courtesy call and so if there is no answer at the home number and we have not heard from you through the emergency physician on call, we will assume that you have returned to your regular daily activities without incident.  SIGNATURES/CONFIDENTIALITY: You and/or your care partner have signed paperwork which will be entered into your electronic medical record.  These signatures attest to the fact that that the information above on your After Visit Summary has been  reviewed and is understood.  Full responsibility of the confidentiality of this discharge information lies with you and/or your care-partner.   Gastrititis information given.  Await biopsy results.    Continue PPI  Post dilation information given.

## 2012-11-25 NOTE — Progress Notes (Signed)
Patient did not experience any of the following events: a burn prior to discharge; a fall within the facility; wrong site/side/patient/procedure/implant event; or a hospital transfer or hospital admission upon discharge from the facility. (G8907) Patient did not have preoperative order for IV antibiotic SSI prophylaxis. (G8918)  

## 2012-11-26 ENCOUNTER — Telehealth: Payer: Self-pay | Admitting: *Deleted

## 2012-11-26 NOTE — Telephone Encounter (Signed)
  Follow up Call-  Call back number 11/25/2012  Post procedure Call Back phone  # [phone]  Permission to leave phone message Yes     Patient questions:  Do you have a fever, pain , or abdominal swelling? no Pain Score  0 *  Have you tolerated food without any problems? yes  Have you been able to return to your normal activities? yes  Do you have any questions about your discharge instructions: Diet   no Medications  no Follow up visit  no  Do you have questions or concerns about your Care? no  Actions: * If pain score is 4 or above: No action needed, pain <4.

## 2012-11-30 ENCOUNTER — Encounter: Payer: Self-pay | Admitting: Gastroenterology

## 2012-12-01 ENCOUNTER — Other Ambulatory Visit: Payer: Self-pay

## 2012-12-01 MED ORDER — METOPROLOL TARTRATE 25 MG PO TABS: ORAL_TABLET | ORAL | Status: DC

## 2012-12-01 NOTE — Telephone Encounter (Signed)
Last seen 04/20/12  MMM 

## 2012-12-15 ENCOUNTER — Telehealth: Payer: Self-pay | Admitting: Family Medicine

## 2012-12-16 ENCOUNTER — Other Ambulatory Visit: Payer: Self-pay | Admitting: Family Medicine

## 2012-12-16 DIAGNOSIS — J441 Chronic obstructive pulmonary disease with (acute) exacerbation: Secondary | ICD-10-CM

## 2012-12-16 MED ORDER — BUDESONIDE-FORMOTEROL FUMARATE 160-4.5 MCG/ACT IN AERO: 2.0000 | INHALATION_SPRAY | Freq: Two times a day (BID) | RESPIRATORY_TRACT | Status: DC

## 2012-12-16 MED ORDER — AZELASTINE-FLUTICASONE 137-50 MCG/ACT NA SUSP: 2.0000 | Freq: Two times a day (BID) | NASAL | Status: DC

## 2012-12-16 NOTE — Telephone Encounter (Signed)
Please advise 

## 2012-12-16 NOTE — Telephone Encounter (Signed)
rx for dymista and symbicort sent to pharm

## 2012-12-20 NOTE — Telephone Encounter (Signed)
Pt went to ED since he had not heard from our office. Wife was very distressed.

## 2012-12-23 ENCOUNTER — Ambulatory Visit (INDEPENDENT_AMBULATORY_CARE_PROVIDER_SITE_OTHER): Payer: PRIVATE HEALTH INSURANCE | Admitting: Cardiology

## 2012-12-23 ENCOUNTER — Encounter: Payer: Self-pay | Admitting: Cardiology

## 2012-12-23 VITALS — BP 143/93 | HR 82 | Ht 66.0 in | Wt 200.8 lb

## 2012-12-23 DIAGNOSIS — I25810 Atherosclerosis of coronary artery bypass graft(s) without angina pectoris: Secondary | ICD-10-CM

## 2012-12-23 NOTE — Progress Notes (Signed)
HPI The patient presents for followup of known coronary disease. He has been some years since he was last seen. He has a history of coronary disease as described below. He does report having a stress perfusion study about 3 years ago in [place].  He says his back for routine followup. He does get some dyspnea. He gets this with activities. He says he's noticed this more recently when he tries to do his walking. He has been told he might have some asthma related to reflux. He's also recently been treated with some antibiotics and thinks he might be getting better. He is not describing any PND or orthopnea. He is not describing any palpitations, presyncope or syncope. Dyspnea was his previous angina. He never had neck or arm pain.  Allergies  Allergen Reactions  . Plavix [Clopidogrel Bisulfate] Shortness Of Breath  . Trilipix [Choline Fenofibrate] Shortness Of Breath    Patient stating it made his tongue swell  . Penicillins     Current Outpatient Prescriptions  Medication Sig Dispense Refill  . ALPRAZolam (XANAX) 0.5 MG tablet Take 1 tablet (0.5 mg total) by mouth at bedtime as needed for sleep.  30 tablet  1  . AMBULATORY NON FORMULARY MEDICATION L-Argen 500 mg Take 1 capsule by mouth once daily      . aspirin 81 MG tablet Take 81 mg by mouth daily.      Marland Kitchen azelastine (ASTELIN) 137 MCG/SPRAY nasal spray Place 1 spray into the nose 2 (two) times daily. Use in each nostril as directed  30 mL  12  . Azelastine-Fluticasone (DYMISTA) 137-50 MCG/ACT SUSP Place 2 sprays into the nose 2 (two) times daily.  1 Bottle  3  . budesonide-formoterol (SYMBICORT) 160-4.5 MCG/ACT inhaler Inhale 2 puffs into the lungs 2 (two) times daily.  1 Inhaler  3  . cetirizine-pseudoephedrine (ZYRTEC-D) 5-120 MG per tablet Take 1 tablet by mouth as needed for allergies.      Marland Kitchen esomeprazole (NEXIUM 24HR) 20 MG capsule Take 20 mg by mouth daily.      . metoprolol tartrate (LOPRESSOR) 25 MG tablet Take 12.5 mg by mouth 2  (two) times daily.       . nitroGLYCERIN (NITROSTAT) 0.4 MG SL tablet Place 1 tablet (0.4 mg total) under the tongue every 5 (five) minutes as needed for chest pain.  25 tablet  1  . rosuvastatin (CRESTOR) 40 MG tablet Take 20 mg by mouth daily.      Marland Kitchen triamcinolone (NASACORT) 55 MCG/ACT nasal inhaler Place 2 sprays into the nose daily.       No current facility-administered medications for this visit.    Past Medical History  Diagnosis Date  . Hyperlipidemia   . GERD (gastroesophageal reflux disease)   . Anxiety   . Asthma   . CAD (coronary artery disease)   . Pneumonia     Past Surgical History  Procedure Laterality Date  . Coronary stent placement  02/20/2003  . Inguinal hernia repair      as a child    Family History  Problem Relation Age of Onset  . Heart attack Father   . Diabetes Maternal Grandmother   . Heart disease Paternal Uncle   . Kidney disease Maternal Aunt     History   Social History  . Marital Status: Married    Spouse Name: N/A    Number of Children: 1  . 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 Smoking status: Former Smoker    Types: Cigarettes    Quit date: 10/11/2001  . Smokeless tobacco: Former Neurosurgeon    Types: Snuff    Quit date: 05/14/2012  . Alcohol Use: Yes     Comment: occasional  . Drug Use: No  . Sexual Activity: Not on file   Other Topics Concern  . Not on file   Social History Narrative  . No narrative on file    ROS:  Positive for hiatal hernia, and joint pains.  As stated in the HPI and negative for all other systems.   PHYSICAL EXAM BP 143/93  Pulse 82  Ht 5\' 6"  (1.676 m)  Wt 200 lb 12.8 oz (91.082 kg)  BMI 32.43 kg/m2 GENERAL:  Well appearing HEENT:  Pupils equal round and reactive, fundi not visualized, oral mucosa unremarkable NECK:  No jugular venous distention, waveform within normal limits, carotid upstroke brisk and symmetric, no bruits, no  thyromegaly LYMPHATICS:  No cervical, inguinal adenopathy LUNGS:  Clear to auscultation bilaterally BACK:  No CVA tenderness CHEST:  Unremarkable HEART:  PMI not displaced or sustained,S1 and S2 within normal limits, no S3, no S4, no clicks, no rubs, no murmurs ABD:  Flat, positive bowel sounds normal in frequency in pitch, no bruits, no rebound, no guarding, no midline pulsatile mass, no hepatomegaly, no splenomegaly EXT:  2 plus pulses throughout, no edema, no cyanosis no clubbing SKIN:  No rashes no nodules NEURO:  Cranial nerves II through XII grossly intact, motor grossly intact throughout PSYCH:  Cognitively intact, oriented to person place and time   EKG:  Sinus rhythm, rate 82, right bundle branch block incomplete, no acute ST-T wave changes.  ASSESSMENT AND PLAN  CAD:  Given this history and his ongoing dyspnea he will need a stress test. He does not think he would be a walk on a treadmill. Therefore, he will have a [employer].  HTN:   His blood pressure is mildly elevated. However, we are going to concentrate on weight loss to try to manage this.  DYSLIPIDEMIA:  This is followed by his primary provider. No change in therapy is indicated.

## 2012-12-23 NOTE — Patient Instructions (Addendum)
The current medical regimen is effective;  continue present plan and medications.  Your physician has requested that you have a lexiscan myoview. For further information please visit [email or web address]. Please follow instruction sheet, as given.  Follow up in 1 year with Dr Hochrein.  You will receive a letter in the mail 2 months before you are due.  Please call us when you receive this letter to schedule your follow up appointment.  

## 2012-12-27 ENCOUNTER — Other Ambulatory Visit: Payer: Self-pay | Admitting: Nurse Practitioner

## 2012-12-27 ENCOUNTER — Other Ambulatory Visit: Payer: Self-pay

## 2012-12-27 NOTE — Telephone Encounter (Signed)
Patient wanted xanax refill- not in epic that he is taking- please check chart

## 2012-12-27 NOTE — Telephone Encounter (Signed)
Last seen 04/20/12  MMM   If approved route to nurse to phone into [facility]  [phone]

## 2012-12-28 ENCOUNTER — Other Ambulatory Visit (INDEPENDENT_AMBULATORY_CARE_PROVIDER_SITE_OTHER): Payer: PRIVATE HEALTH INSURANCE

## 2012-12-28 DIAGNOSIS — R5383 Other fatigue: Secondary | ICD-10-CM

## 2012-12-28 DIAGNOSIS — R5381 Other malaise: Secondary | ICD-10-CM

## 2012-12-28 DIAGNOSIS — I10 Essential (primary) hypertension: Secondary | ICD-10-CM

## 2012-12-28 DIAGNOSIS — Z125 Encounter for screening for malignant neoplasm of prostate: Secondary | ICD-10-CM

## 2012-12-28 DIAGNOSIS — E785 Hyperlipidemia, unspecified: Secondary | ICD-10-CM

## 2012-12-28 LAB — POCT CBC
Granulocyte percent: 54 %G (ref 37–80)
HCT, POC: 48.7 % (ref 43.5–53.7)
Hemoglobin: 16.9 g/dL (ref 14.1–18.1)
Lymph, poc: 3 (ref 0.6–3.4)
MCH, POC: 29.5 pg (ref 27–31.2)
MCHC: 34.7 g/dL (ref 31.8–35.4)
MCV: 85.1 fL (ref 80–97)
MPV: 8.5 fL (ref 0–99.8)
POC Granulocyte: 3.8 (ref 2–6.9)
POC LYMPH PERCENT: 42.9 %L (ref 10–50)
Platelet Count, POC: 242 10*3/uL (ref 142–424)
RBC: 5.7 M/uL (ref 4.69–6.13)
RDW, POC: 13 %
WBC: 7 10*3/uL (ref 4.6–10.2)

## 2012-12-28 NOTE — Telephone Encounter (Signed)
Patient needs to be seen fo xanax- not in chart or in epic

## 2012-12-28 NOTE — Progress Notes (Signed)
Patient came in for labs only.

## 2012-12-28 NOTE — Telephone Encounter (Signed)
Chart on desk

## 2012-12-29 LAB — CMP14+EGFR
ALT: 30 IU/L (ref 0–44)
AST: 27 IU/L (ref 0–40)
Albumin/Globulin Ratio: 2.1 (ref 1.1–2.5)
Albumin: 4.4 g/dL (ref 3.5–5.5)
Alkaline Phosphatase: 84 IU/L (ref 39–117)
BUN/Creatinine Ratio: 11 (ref 9–20)
BUN: 8 mg/dL (ref 6–24)
CO2: 27 mmol/L (ref 18–29)
Calcium: 9.7 mg/dL (ref 8.7–10.2)
Chloride: 102 mmol/L (ref 97–108)
Creatinine, Ser: 0.73 mg/dL — ABNORMAL LOW (ref 0.76–1.27)
GFR calc Af Amer: 128 mL/min/{1.73_m2} (ref >59)
GFR calc non Af Amer: 110 mL/min/{1.73_m2} (ref >59)
Globulin, Total: 2.1 g/dL (ref 1.5–4.5)
Glucose: 96 mg/dL (ref 65–99)
Potassium: 4.5 mmol/L (ref 3.5–5.2)
Sodium: 144 mmol/L (ref 134–144)
Total Bilirubin: 0.3 mg/dL (ref 0.0–1.2)
Total Protein: 6.5 g/dL (ref 6.0–8.5)

## 2012-12-29 LAB — LIPID PANEL
Chol/HDL Ratio: 3.5 ratio units (ref 0.0–5.0)
Cholesterol, Total: 105 mg/dL (ref 100–199)
HDL: 30 mg/dL — ABNORMAL LOW (ref >39)
LDL Calculated: 42 mg/dL (ref 0–99)
Triglycerides: 165 mg/dL — ABNORMAL HIGH (ref 0–149)
VLDL Cholesterol Cal: 33 mg/dL (ref 5–40)

## 2012-12-29 LAB — PSA, TOTAL AND FREE
PSA, Free Pct: 18.2 %
PSA, Free: 0.31 ng/mL
PSA: 1.7 ng/mL (ref 0.0–4.0)

## 2012-12-29 LAB — TSH: TSH: 0.882 u[IU]/mL (ref 0.450–4.500)

## 2012-12-31 ENCOUNTER — Telehealth: Payer: Self-pay | Admitting: *Deleted

## 2012-12-31 ENCOUNTER — Other Ambulatory Visit: Payer: Self-pay

## 2012-12-31 ENCOUNTER — Other Ambulatory Visit: Payer: Self-pay | Admitting: Family Medicine

## 2012-12-31 DIAGNOSIS — F411 Generalized anxiety disorder: Secondary | ICD-10-CM

## 2012-12-31 MED ORDER — ALPRAZOLAM 0.5 MG PO TABS: 0.5000 mg | ORAL_TABLET | Freq: Every evening | ORAL | Status: DC | PRN

## 2012-12-31 NOTE — Telephone Encounter (Signed)
Please call in rx for xanax with 1 refill 

## 2012-12-31 NOTE — Telephone Encounter (Signed)
Last seen 04/20/12 MMM  If approved route to nurse to call into [facility]  [phone]

## 2012-12-31 NOTE — Telephone Encounter (Signed)
Patient last seen in office on 11-11-12. Rx last filled on 11-29-12 for #30. Please advise. If approved please route to Pool A so nurse can call in to [facility] pharmacy at [phone]

## 2013-01-03 ENCOUNTER — Other Ambulatory Visit: Payer: Self-pay

## 2013-01-03 NOTE — Telephone Encounter (Signed)
Called in.

## 2013-01-03 NOTE — Telephone Encounter (Signed)
Last seen 04/20/12  MMM

## 2013-01-05 ENCOUNTER — Encounter: Payer: Self-pay | Admitting: Cardiology

## 2013-01-06 ENCOUNTER — Ambulatory Visit (HOSPITAL_COMMUNITY): Payer: No Typology Code available for payment source | Attending: Cardiology | Admitting: Radiology

## 2013-01-06 ENCOUNTER — Other Ambulatory Visit: Payer: Self-pay | Admitting: *Deleted

## 2013-01-06 VITALS — BP 131/87 | HR 72 | Ht 66.0 in | Wt 200.0 lb

## 2013-01-06 DIAGNOSIS — R079 Chest pain, unspecified: Secondary | ICD-10-CM

## 2013-01-06 DIAGNOSIS — I25810 Atherosclerosis of coronary artery bypass graft(s) without angina pectoris: Secondary | ICD-10-CM | POA: Insufficient documentation

## 2013-01-06 DIAGNOSIS — I2510 Atherosclerotic heart disease of native coronary artery without angina pectoris: Secondary | ICD-10-CM

## 2013-01-06 DIAGNOSIS — R0602 Shortness of breath: Secondary | ICD-10-CM

## 2013-01-06 MED ORDER — REGADENOSON 0.4 MG/5ML IV SOLN
0.4000 mg | Freq: Once | INTRAVENOUS | Status: AC
  Administered 2013-01-06: 0.4 mg via INTRAVENOUS

## 2013-01-06 MED ORDER — METOPROLOL TARTRATE 25 MG PO TABS: ORAL_TABLET | ORAL | Status: DC

## 2013-01-06 MED ORDER — TECHNETIUM TC 99M SESTAMIBI GENERIC - CARDIOLITE
11.0000 | Freq: Once | INTRAVENOUS | Status: AC | PRN
  Administered 2013-01-06: 11 via INTRAVENOUS

## 2013-01-06 MED ORDER — TECHNETIUM TC 99M SESTAMIBI GENERIC - CARDIOLITE
33.0000 | Freq: Once | INTRAVENOUS | Status: AC | PRN
  Administered 2013-01-06: 33 via INTRAVENOUS

## 2013-01-06 NOTE — Telephone Encounter (Signed)
NTBS.

## 2013-01-06 NOTE — Progress Notes (Signed)
[facility] SITE 3 NUCLEAR MED [address] [place], [place] [address] [phone]    Cardiology Nuclear Med Study  Jason Braun is a 47 y.o. male     MRN : [id number]     DOB: 1966-01-11  Procedure Date: 01/06/2013  Nuclear Med Background Indication for Stress Test:  Evaluation for Ischemia and Stent Patency History: Catheterization> stent of RCA, EF 65%, residual non-obstructive CAD, 07/09 Myocardial Perfusion Study: mild inferior ischemia, possible attenuation, more prominent from previous scan, EF 63%. Cardiac Risk Factors: Family History - CAD, History of Smoking, Hypertension and Lipids  Symptoms:  Chest Tightness with Exertion (last date of chest discomfort was one month ago), Diaphoresis, DOE, Fatigue, Fatigue with Exertion and Nausea   Nuclear Pre-Procedure Caffeine/Decaff Intake:  None NPO After: 7:00pm   Lungs:  Clear O2 Sat: 97% on RA IV 0.9% NS with Angio Cath:  22g  IV Site: R Hand  IV Started by:  Bonnita Levan, RN  Chest Size (in):  46 Cup Size: n/a  Height: 5\' 6"  (1.676 m)  Weight:  200 lb (90.719 kg)  BMI:  Body mass index is 32.3 kg/(m^2). Tech Comments:  N/A    Nuclear Med Study 1 or 2 day study: 1 day  Stress Test Type:  Treadmill/Lexiscan  Reading MD: Tobias Alexander, MD  Order Authorizing Provider:  Rollene Rotunda, MD  Resting Radionuclide: Technetium 61m Sestamibi  Resting Radionuclide Dose: 11.0 mCi   Stress Radionuclide:  Technetium 67m Sestamibi  Stress Radionuclide Dose: 33.0 mCi           Stress Protocol Rest HR: 72 Stress HR: 105  Rest BP: 131/87 Stress BP: 160/99  Exercise Time (min): 2.00 METS: n/a   Predicted Max HR: 173 bpm % Max HR: 60.69 bpm Rate Pressure Product: 21308   Dose of Adenosine (mg):  n/a Dose of Lexiscan: 0.4 mg  Dose of Atropine (mg): n/a Dose of Dobutamine: n/a mcg/kg/min (at max HR)  Stress Test Technologist: Irean Hong, RN  Nuclear Technologist:  Domenic Polite, CNMT     Rest Procedure:   Myocardial perfusion imaging was performed at rest 45 minutes following the intravenous administration of Technetium 38m Sestamibi. Rest ECG: SR, iRBBB  Stress Procedure:  The patient received IV Lexiscan 0.4 mg over 15-seconds with concurrent low level exercise and then Technetium 49m Sestamibi was injected at 30-seconds while the patient continued walking one more minute. Patient complained of DOE, but no chest pain with Lexiscan. Quantitative spect images were obtained after a 45-minute delay. Stress ECG: No significant change from baseline ECG  QPS Raw Data Images:  Normal; no motion artifact; normal heart/lung ratio. Stress Images:  There is a medium area moderate severity perfusion defect in the basal and mid inferior and basal inferolateral walls.  Rest Images:  There is a medium area moderate severity perfusion defect in the basal and mid inferior and basal inferolateral walls.  Subtraction (SDS):  No evidence of ischemia. Transient Ischemic Dilatation (Normal <1.22):  n/a Lung/Heart Ratio (Normal <0.45):  0.54  Quantitative Gated Spect Images QGS EDV:  116 ml QGS ESV:  55 ml  Impression Exercise Capacity:  Lexiscan with low level exercise. BP Response:  Normal blood pressure response. Clinical Symptoms:  No significant symptoms noted. ECG Impression:  No significant ST segment change suggestive of ischemia. Comparison with Prior Nuclear Study: There is no ischemia described on the previous scan.  Overall Impression:  Low risk stress nuclear study with a fixed defect in  the mid and basal inferior and basal inferolateral walls that might represent an attenuation artifact..  LV Ejection Fraction: 52%.  LV Wall Motion:  NL LV Function; NL Wall Motion    Tobias Alexander, Rexene Edison 01/06/2013

## 2013-01-06 NOTE — Telephone Encounter (Signed)
Patient notified at last refill that NTBS. Please advise 

## 2013-01-11 ENCOUNTER — Telehealth: Payer: Self-pay | Admitting: Cardiology

## 2013-01-11 ENCOUNTER — Encounter: Payer: Self-pay | Admitting: Family Medicine

## 2013-01-11 NOTE — Telephone Encounter (Signed)
Follow Up  Pt's wife returning call for results.

## 2013-01-11 NOTE — Telephone Encounter (Signed)
Aware of results. 

## 2013-01-12 ENCOUNTER — Telehealth: Payer: Self-pay | Admitting: *Deleted

## 2013-01-12 MED ORDER — METOPROLOL TARTRATE 25 MG PO TABS: 12.5000 mg | ORAL_TABLET | Freq: Two times a day (BID) | ORAL | Status: DC

## 2013-01-12 NOTE — Telephone Encounter (Signed)
Pt needs refill on Metoprolol

## 2013-01-13 ENCOUNTER — Other Ambulatory Visit: Payer: Self-pay | Admitting: Family Medicine

## 2013-01-13 MED ORDER — METOPROLOL TARTRATE 25 MG PO TABS: 12.5000 mg | ORAL_TABLET | Freq: Two times a day (BID) | ORAL | Status: DC

## 2013-01-13 NOTE — Telephone Encounter (Signed)
Metoprolol sent to pharmacy

## 2013-02-10 ENCOUNTER — Other Ambulatory Visit: Payer: Self-pay

## 2013-02-24 ENCOUNTER — Other Ambulatory Visit: Payer: Self-pay | Admitting: *Deleted

## 2013-02-24 MED ORDER — ROSUVASTATIN CALCIUM 40 MG PO TABS: ORAL_TABLET | ORAL | Status: DC

## 2013-02-24 NOTE — Telephone Encounter (Signed)
nts

## 2013-02-24 NOTE — Telephone Encounter (Signed)
LAST LIPID 1/14. LAST OV 1/14. NTBS

## 2013-03-28 ENCOUNTER — Other Ambulatory Visit: Payer: Self-pay

## 2013-03-28 NOTE — Telephone Encounter (Signed)
No refills until seen for appointment

## 2013-03-28 NOTE — Telephone Encounter (Signed)
Last lipid 1/14  MMM

## 2013-03-30 ENCOUNTER — Other Ambulatory Visit: Payer: Self-pay | Admitting: *Deleted

## 2013-03-30 MED ORDER — ROSUVASTATIN CALCIUM 40 MG PO TABS: ORAL_TABLET | ORAL | Status: DC

## 2013-03-30 NOTE — Telephone Encounter (Signed)
ntbs

## 2013-03-30 NOTE — Telephone Encounter (Signed)
Last seen 04/20/12

## 2013-04-28 ENCOUNTER — Other Ambulatory Visit: Payer: Self-pay | Admitting: *Deleted

## 2013-05-02 ENCOUNTER — Telehealth: Payer: Self-pay | Admitting: Nurse Practitioner

## 2013-05-02 ENCOUNTER — Other Ambulatory Visit: Payer: Self-pay | Admitting: *Deleted

## 2013-05-02 NOTE — Telephone Encounter (Signed)
nTBS for refill of cholesterol meds

## 2013-05-02 NOTE — Telephone Encounter (Signed)
Patient notified at last refill that NTBS. Please advise 

## 2013-05-03 ENCOUNTER — Other Ambulatory Visit: Payer: Self-pay | Admitting: *Deleted

## 2013-05-03 MED ORDER — ROSUVASTATIN CALCIUM 40 MG PO TABS: 20.0000 mg | ORAL_TABLET | Freq: Every day | ORAL | Status: DC

## 2013-05-03 MED ORDER — METOPROLOL TARTRATE 25 MG PO TABS: 12.5000 mg | ORAL_TABLET | Freq: Two times a day (BID) | ORAL | Status: DC

## 2013-05-03 NOTE — Telephone Encounter (Signed)
I spoke with pt wife and told her I sent refill request now to [facility]. Check with pharmacy tonight.

## 2013-07-28 ENCOUNTER — Telehealth: Payer: Self-pay | Admitting: Family Medicine

## 2013-07-28 NOTE — Telephone Encounter (Signed)
Apt made for Monday- wife stated he can not get off on Friday to come.

## 2013-08-01 ENCOUNTER — Encounter: Payer: Self-pay | Admitting: Family Medicine

## 2013-08-01 ENCOUNTER — Ambulatory Visit (INDEPENDENT_AMBULATORY_CARE_PROVIDER_SITE_OTHER): Payer: PRIVATE HEALTH INSURANCE | Admitting: Family Medicine

## 2013-08-01 VITALS — BP 163/102 | HR 76 | Temp 98.3°F | Ht 66.0 in | Wt 205.4 lb

## 2013-08-01 DIAGNOSIS — J302 Other seasonal allergic rhinitis: Secondary | ICD-10-CM

## 2013-08-01 DIAGNOSIS — R300 Dysuria: Secondary | ICD-10-CM

## 2013-08-01 DIAGNOSIS — F411 Generalized anxiety disorder: Secondary | ICD-10-CM

## 2013-08-01 DIAGNOSIS — J069 Acute upper respiratory infection, unspecified: Secondary | ICD-10-CM

## 2013-08-01 DIAGNOSIS — J309 Allergic rhinitis, unspecified: Secondary | ICD-10-CM

## 2013-08-01 DIAGNOSIS — I2510 Atherosclerotic heart disease of native coronary artery without angina pectoris: Secondary | ICD-10-CM

## 2013-08-01 DIAGNOSIS — E785 Hyperlipidemia, unspecified: Secondary | ICD-10-CM

## 2013-08-01 DIAGNOSIS — R350 Frequency of micturition: Secondary | ICD-10-CM

## 2013-08-01 DIAGNOSIS — Z0000 Encounter for general adult medical examination without abnormal findings: Secondary | ICD-10-CM

## 2013-08-01 DIAGNOSIS — K219 Gastro-esophageal reflux disease without esophagitis: Secondary | ICD-10-CM

## 2013-08-01 LAB — POCT CBC
Granulocyte percent: 56.5 %G (ref 37–80)
HCT, POC: 55.6 % — AB (ref 43.5–53.7)
Hemoglobin: 17.6 g/dL (ref 14.1–18.1)
Lymph, poc: 2.5 (ref 0.6–3.4)
MCH, POC: 27 pg (ref 27–31.2)
MCHC: 31.7 g/dL — AB (ref 31.8–35.4)
MCV: 85.2 fL (ref 80–97)
MPV: 9.2 fL (ref 0–99.8)
POC Granulocyte: 3.8 (ref 2–6.9)
POC LYMPH PERCENT: 37.7 %L (ref 10–50)
Platelet Count, POC: 230 10*3/uL (ref 142–424)
RBC: 6.5 M/uL — AB (ref 4.69–6.13)
RDW, POC: 14 %
WBC: 6.7 10*3/uL (ref 4.6–10.2)

## 2013-08-01 LAB — POCT URINALYSIS DIPSTICK
Bilirubin, UA: NEGATIVE
Glucose, UA: NEGATIVE
Ketones, UA: NEGATIVE
Leukocytes, UA: NEGATIVE
Nitrite, UA: NEGATIVE
Protein, UA: NEGATIVE
Spec Grav, UA: 1.025
Urobilinogen, UA: NEGATIVE
pH, UA: 6

## 2013-08-01 LAB — POCT UA - MICROSCOPIC ONLY
Bacteria, U Microscopic: NEGATIVE
Casts, Ur, LPF, POC: NEGATIVE
Crystals, Ur, HPF, POC: NEGATIVE
Epithelial cells, urine per micros: NEGATIVE
WBC, Ur, HPF, POC: NEGATIVE
Yeast, UA: NEGATIVE

## 2013-08-01 MED ORDER — METHYLPREDNISOLONE ACETATE 80 MG/ML IJ SUSP
80.0000 mg | Freq: Once | INTRAMUSCULAR | Status: AC
  Administered 2013-08-01: 80 mg via INTRAMUSCULAR

## 2013-08-01 MED ORDER — ALBUTEROL SULFATE HFA 108 (90 BASE) MCG/ACT IN AERS: 2.0000 | INHALATION_SPRAY | Freq: Four times a day (QID) | RESPIRATORY_TRACT | Status: DC | PRN

## 2013-08-01 MED ORDER — ALPRAZOLAM 0.5 MG PO TABS: 0.5000 mg | ORAL_TABLET | Freq: Every evening | ORAL | Status: DC | PRN

## 2013-08-01 MED ORDER — CIPROFLOXACIN HCL 500 MG PO TABS: 500.0000 mg | ORAL_TABLET | Freq: Two times a day (BID) | ORAL | Status: DC

## 2013-08-01 MED ORDER — ESOMEPRAZOLE MAGNESIUM 20 MG PO CPDR: 20.0000 mg | DELAYED_RELEASE_CAPSULE | Freq: Every day | ORAL | Status: DC

## 2013-08-01 MED ORDER — ROSUVASTATIN CALCIUM 40 MG PO TABS: 20.0000 mg | ORAL_TABLET | Freq: Every day | ORAL | Status: DC

## 2013-08-01 MED ORDER — METOPROLOL TARTRATE 25 MG PO TABS: 12.5000 mg | ORAL_TABLET | Freq: Two times a day (BID) | ORAL | Status: DC

## 2013-08-01 NOTE — Progress Notes (Signed)
Subjective:    Patient ID: Jason Braun, male    DOB: 1966-04-06, 48 y.o.   MRN: [id number]  HPI This 48 y.o. male presents for evaluation of urinary frequency, CAD, hypertension, and hyperlipidemia. He has been having more frequency and some dysuria.  He needs refills and is due for CPE labs. He is seeing Cardiology Dr. Rosie Fate and has recently been seen.  The allergies have been bothering Him and he now has a URI and cough..  Review of Systems    No chest pain, SOB, HA, dizziness, vision change, N/V, diarrhea, constipation, dysuria, urinary urgency or frequency, myalgias, arthralgias or rash.  Objective:   Physical Exam Vital signs noted  Well developed well nourished male.  HEENT - Head atraumatic Normocephalic                Eyes - PERRLA, Conjuctiva - clear Sclera- Clear EOMI                Ears - EAC's Wnl TM's Wnl Gross Hearing WNL                Nose - Nares patent                 Throat - oropharanx wnl Respiratory - Lungs CTA bilateral Cardiac - RRR S1 and S2 without murmur GI - Abdomen soft Nontender and bowel sounds active x 4 Extremities - No edema. Neuro - Grossly intact.  Results for orders placed in visit on 08/01/13  POCT URINALYSIS DIPSTICK      Result Value Ref Range   Color, UA amber     Clarity, UA clear     Glucose, UA negative     Bilirubin, UA negative     Ketones, UA negative     Spec Grav, UA 1.025     Blood, UA trace     pH, UA 6.0     Protein, UA negative     Urobilinogen, UA negative     Nitrite, UA negative     Leukocytes, UA Negative    POCT UA - MICROSCOPIC ONLY      Result Value Ref Range   WBC, Ur, HPF, POC negative     RBC, urine, microscopic occ     Bacteria, U Microscopic negative     Mucus, UA occ     Epithelial cells, urine per micros negative     Crystals, Ur, HPF, POC negative     Casts, Ur, LPF, POC negative     Yeast, UA negative    POCT CBC      Result Value Ref Range   WBC 6.7  4.6 - 10.2 K/uL   Lymph, poc  2.5  0.6 - 3.4   POC LYMPH PERCENT 37.7  10 - 50 %L   POC Granulocyte 3.8  2 - 6.9   Granulocyte percent 56.5  37 - 80 %G   RBC 6.5 (*) 4.69 - 6.13 M/uL   Hemoglobin 17.6  14.1 - 18.1 g/dL   HCT, POC 55.6 (*) 43.5 - 53.7 %   MCV 85.2  80 - 97 fL   MCH, POC 27.0  27 - 31.2 pg   MCHC 31.7 (*) 31.8 - 35.4 g/dL   RDW, POC 14.0     Platelet Count, POC 230.0  142 - 424 K/uL   MPV 9.2  0 - 99.8 fL       Assessment & Plan:  Urinary frequency - Plan: POCT urinalysis dipstick, POCT  UA - Microscopic Only, albuterol (PROVENTIL HFA;VENTOLIN HFA) 108 (90 BASE) MCG/ACT inhaler, ciprofloxacin (CIPRO) 500 MG tablet  Burning with urination - Plan: POCT urinalysis dipstick, POCT UA - Microscopic Only, albuterol (PROVENTIL HFA;VENTOLIN HFA) 108 (90 BASE) MCG/ACT inhaler, ciprofloxacin (CIPRO) 500 MG tablet, Urine culture  URI, acute - Plan: albuterol (PROVENTIL HFA;VENTOLIN HFA) 108 (90 BASE) MCG/ACT inhaler, ciprofloxacin (CIPRO) 500 MG tablet  Seasonal allergies - Plan: methylPREDNISolone acetate (DEPO-MEDROL) injection 80 mg  Anxiety state, unspecified - Plan: ALPRAZolam (XANAX) 0.5 MG tablet  Routine general medical examination at a health care facility - Plan: POCT CBC, CMP14+EGFR, Lipid panel, Thyroid Panel With TSH, PSA, total and free, albuterol (PROVENTIL HFA;VENTOLIN HFA) 108 (90 BASE) MCG/ACT inhaler  Other and unspecified hyperlipidemia - Plan: rosuvastatin (CRESTOR) 40 MG tablet  CAD (coronary artery disease) - Plan: metoprolol tartrate (LOPRESSOR) 25 MG tablet  GERD (gastroesophageal reflux disease) - Plan: esomeprazole (NEXIUM 24HR) 20 MG capsule  Follow up in 6 months  Lysbeth Penner FNP

## 2013-08-01 NOTE — Progress Notes (Signed)
Patient ID: Jason Braun, male   DOB: 02-Oct-1965, 48 y.o.   MRN: [id number] Pt tolerated inj well

## 2013-08-02 LAB — LIPID PANEL
Chol/HDL Ratio: 2.9 ratio units (ref 0.0–5.0)
Cholesterol, Total: 99 mg/dL — ABNORMAL LOW (ref 100–199)
HDL: 34 mg/dL — ABNORMAL LOW (ref >39)
LDL Calculated: 37 mg/dL (ref 0–99)
Triglycerides: 141 mg/dL (ref 0–149)
VLDL Cholesterol Cal: 28 mg/dL (ref 5–40)

## 2013-08-02 LAB — PSA, TOTAL AND FREE
PSA, Free Pct: 25.8 %
PSA, Free: 0.31 ng/mL
PSA: 1.2 ng/mL (ref 0.0–4.0)

## 2013-08-02 LAB — CMP14+EGFR
ALT: 25 IU/L (ref 0–44)
AST: 20 IU/L (ref 0–40)
Albumin/Globulin Ratio: 2 (ref 1.1–2.5)
Albumin: 4.5 g/dL (ref 3.5–5.5)
Alkaline Phosphatase: 97 IU/L (ref 39–117)
BUN/Creatinine Ratio: 11 (ref 9–20)
BUN: 9 mg/dL (ref 6–24)
CO2: 24 mmol/L (ref 18–29)
Calcium: 9.7 mg/dL (ref 8.7–10.2)
Chloride: 102 mmol/L (ref 97–108)
Creatinine, Ser: 0.84 mg/dL (ref 0.76–1.27)
GFR calc Af Amer: 120 mL/min/{1.73_m2} (ref >59)
GFR calc non Af Amer: 104 mL/min/{1.73_m2} (ref >59)
Globulin, Total: 2.2 g/dL (ref 1.5–4.5)
Glucose: 104 mg/dL — ABNORMAL HIGH (ref 65–99)
Potassium: 4.5 mmol/L (ref 3.5–5.2)
Sodium: 142 mmol/L (ref 134–144)
Total Bilirubin: 0.5 mg/dL (ref 0.0–1.2)
Total Protein: 6.7 g/dL (ref 6.0–8.5)

## 2013-08-02 LAB — URINE CULTURE: Organism ID, Bacteria: NO GROWTH

## 2013-08-02 LAB — THYROID PANEL WITH TSH
Free Thyroxine Index: 1.8 (ref 1.2–4.9)
T3 Uptake Ratio: 25 % (ref 24–39)
T4, Total: 7.2 ug/dL (ref 4.5–12.0)
TSH: 1.39 u[IU]/mL (ref 0.450–4.500)

## 2013-08-02 LAB — SPECIMEN STATUS REPORT

## 2013-10-17 ENCOUNTER — Telehealth: Payer: Self-pay | Admitting: Family Medicine

## 2013-10-18 ENCOUNTER — Other Ambulatory Visit: Payer: Self-pay | Admitting: Family Medicine

## 2013-10-18 MED ORDER — DOXYCYCLINE HYCLATE 100 MG PO TABS: 100.0000 mg | ORAL_TABLET | Freq: Two times a day (BID) | ORAL | Status: DC

## 2013-10-18 NOTE — Telephone Encounter (Signed)
rx of doxy sent

## 2013-10-18 NOTE — Telephone Encounter (Signed)
Patient notified that rx sent to pharmacy 

## 2013-11-11 ENCOUNTER — Other Ambulatory Visit: Payer: Self-pay

## 2013-11-11 MED ORDER — NITROGLYCERIN 0.4 MG SL SUBL: 0.4000 mg | SUBLINGUAL_TABLET | SUBLINGUAL | Status: AC | PRN

## 2014-01-20 ENCOUNTER — Other Ambulatory Visit: Payer: Self-pay

## 2014-04-29 ENCOUNTER — Other Ambulatory Visit: Payer: Self-pay | Admitting: *Deleted

## 2014-04-29 DIAGNOSIS — R350 Frequency of micturition: Secondary | ICD-10-CM

## 2014-04-29 DIAGNOSIS — Z0000 Encounter for general adult medical examination without abnormal findings: Secondary | ICD-10-CM

## 2014-04-29 DIAGNOSIS — J069 Acute upper respiratory infection, unspecified: Secondary | ICD-10-CM

## 2014-04-29 DIAGNOSIS — R300 Dysuria: Secondary | ICD-10-CM

## 2014-04-29 MED ORDER — ALBUTEROL SULFATE HFA 108 (90 BASE) MCG/ACT IN AERS: 2.0000 | INHALATION_SPRAY | Freq: Four times a day (QID) | RESPIRATORY_TRACT | Status: DC | PRN

## 2014-05-03 ENCOUNTER — Other Ambulatory Visit: Payer: Self-pay | Admitting: *Deleted

## 2014-05-03 ENCOUNTER — Other Ambulatory Visit: Payer: Self-pay | Admitting: Family Medicine

## 2014-05-03 DIAGNOSIS — F411 Generalized anxiety disorder: Secondary | ICD-10-CM

## 2014-05-03 NOTE — Telephone Encounter (Signed)
Last filled 01/27/2014

## 2014-05-04 MED ORDER — ALPRAZOLAM 0.5 MG PO TABS: 0.5000 mg | ORAL_TABLET | Freq: Every evening | ORAL | Status: DC | PRN

## 2014-05-05 ENCOUNTER — Telehealth: Payer: Self-pay | Admitting: *Deleted

## 2014-05-05 NOTE — Telephone Encounter (Signed)
rx called into pharmacy

## 2014-05-05 NOTE — Telephone Encounter (Signed)
Pt notified of RX called into pharmacy

## 2014-05-23 ENCOUNTER — Ambulatory Visit (INDEPENDENT_AMBULATORY_CARE_PROVIDER_SITE_OTHER): Payer: PRIVATE HEALTH INSURANCE | Admitting: Family Medicine

## 2014-05-23 ENCOUNTER — Encounter: Payer: Self-pay | Admitting: Family Medicine

## 2014-05-23 VITALS — BP 154/94 | HR 88 | Temp 97.4°F | Ht 66.0 in | Wt 207.0 lb

## 2014-05-23 DIAGNOSIS — I10 Essential (primary) hypertension: Secondary | ICD-10-CM

## 2014-05-23 DIAGNOSIS — R5383 Other fatigue: Secondary | ICD-10-CM | POA: Diagnosis not present

## 2014-05-23 DIAGNOSIS — K219 Gastro-esophageal reflux disease without esophagitis: Secondary | ICD-10-CM | POA: Diagnosis not present

## 2014-05-23 DIAGNOSIS — E785 Hyperlipidemia, unspecified: Secondary | ICD-10-CM | POA: Diagnosis not present

## 2014-05-23 MED ORDER — LANSOPRAZOLE 30 MG PO CPDR: 30.0000 mg | DELAYED_RELEASE_CAPSULE | Freq: Two times a day (BID) | ORAL | Status: DC

## 2014-05-23 MED ORDER — LANSOPRAZOLE 30 MG PO CPDR: 60.0000 mg | DELAYED_RELEASE_CAPSULE | Freq: Two times a day (BID) | ORAL | Status: DC

## 2014-05-23 NOTE — Progress Notes (Signed)
Subjective:  Patient ID: Jason Braun, male    DOB: 04-26-1965  Age: 49 y.o. MRN: [id number]  CC: Hypertension; Hyperlipidemia; and Gastrophageal Reflux HPI Jason Braun presents for follow-up of hypertension. Patient has no history of headache chest pain or shortness of breath or recent cough. Patient also denies symptoms of TIA such as numbness weakness lateralizing. Patient checks  blood pressure at home and has not had any elevated readings recently. Patient's daughter checks his blood pressure for many home but it has been a while since they've done. But usually it's in the 120s/70s Patient denies side effects from his medication. States taking it daily but only once a day even though was prescribed twice a day. Patient in for follow-up of elevated cholesterol. Doing well without complaints on current medication. Denies side effects of statin including myalgia and arthralgia and nausea. Also in today for liver function testing. Currently no chest pain, shortness of breath or other cardiovascular related symptoms noted. Patient in for follow-up of GERD. He is currently symptomatic taking his PPI twice daily. There is belching and frequent heartburn. He is taking the medication regularly. Describes dysphagia or choking at night. Had EGD 1-2 years ago.     History Jason Braun has a past medical history of Hyperlipidemia; GERD (gastroesophageal reflux disease); Anxiety; Asthma; and CAD (coronary artery disease).   He has past surgical history that includes Coronary stent placement (02/20/2003) and Inguinal hernia repair.   His family history includes Diabetes in his maternal grandmother; Heart attack (age of onset: 29) in his father; Heart disease in his paternal uncle; Kidney disease in his maternal aunt.He reports that he quit smoking about 12 years ago. His smoking use included Cigarettes. He has quit using smokeless tobacco. His smokeless tobacco use included Snuff. He  reports that he drinks alcohol. He reports that he does not use illicit drugs.  Current Outpatient Prescriptions on File Prior to Visit  Medication Sig Dispense Refill  . albuterol (PROVENTIL HFA;VENTOLIN HFA) 108 (90 BASE) MCG/ACT inhaler Inhale 2 puffs into the lungs every 6 (six) hours as needed for wheezing or shortness of breath. 1 Inhaler 0  . ALPRAZolam (XANAX) 0.5 MG tablet Take 1 tablet (0.5 mg total) by mouth at bedtime as needed for sleep. 30 tablet 3  . aspirin 81 MG tablet Take 81 mg by mouth daily.    . metoprolol tartrate (LOPRESSOR) 25 MG tablet Take 0.5 tablets (12.5 mg total) by mouth 2 (two) times daily. 30 tablet 11  . nitroGLYCERIN (NITROSTAT) 0.4 MG SL tablet Place 1 tablet (0.4 mg total) under the tongue every 5 (five) minutes as needed for chest pain. 25 tablet 0  . rosuvastatin (CRESTOR) 40 MG tablet TAKE 1/2 TABLET BY MOUTH DAILY 15 tablet 0   No current facility-administered medications on file prior to visit.    ROS Review of Systems  Constitutional: Negative for fever, chills, diaphoresis and unexpected weight change.  HENT: Negative for congestion, hearing loss, rhinorrhea, sore throat and trouble swallowing.   Respiratory: Negative for cough, chest tightness, shortness of breath and wheezing.   Gastrointestinal: Negative for nausea, vomiting, abdominal pain, diarrhea, constipation and abdominal distention.  Endocrine: Negative for cold intolerance and heat intolerance.  Genitourinary: Negative for dysuria, hematuria and flank pain.  Musculoskeletal: Negative for joint swelling and arthralgias.  Skin: Negative for rash.  Neurological: Negative for dizziness and headaches.  Psychiatric/Behavioral: Negative for dysphoric mood, decreased concentration and agitation. The patient is not nervous/anxious.  Objective:  BP 154/94 mmHg  Pulse 88  Temp(Src) 97.4 F (36.3 C) (Oral)  Ht _0  (1.676 m)  Wt 207 lb (93.895 kg)  BMI 33.43 kg/m2  BP Readings from  Last 3 Encounters:  05/23/14 154/94  08/01/13 163/102  01/06/13 131/87    Wt Readings from Last 3 Encounters:  05/23/14 207 lb (93.895 kg)  08/01/13 205 lb 6.4 oz (93.169 kg)  01/06/13 200 lb (90.719 kg)     Physical Exam  Constitutional: He is oriented to person, place, and time. He appears well-developed and well-nourished. No distress.  HENT:  Head: Normocephalic and atraumatic.  Right Ear: External ear normal.  Left Ear: External ear normal.  Nose: Nose normal.  Mouth/Throat: Oropharynx is clear and moist.  Eyes: Conjunctivae and EOM are normal. Pupils are equal, round, and reactive to light.  Neck: Normal range of motion. Neck supple. No thyromegaly present.  Cardiovascular: Normal rate, regular rhythm and normal heart sounds.   No murmur heard. Pulmonary/Chest: Effort normal and breath sounds normal. No respiratory distress. He has no wheezes. He has no rales.  Abdominal: Soft. Bowel sounds are normal. He exhibits no distension. There is no tenderness.  Lymphadenopathy:    He has no cervical adenopathy.  Neurological: He is alert and oriented to person, place, and time. He has normal reflexes.  Skin: Skin is warm and dry.  Psychiatric: He has a normal mood and affect. His behavior is normal. Judgment and thought content normal.    No results found for: HGBA1C  Lab Results  Component Value Date   WBC 6.7 08/01/2013   HGB 17.6 08/01/2013   HCT 55.6* 08/01/2013   PLT 239 11/07/2009   GLUCOSE 104* 08/01/2013   TRIG 141 08/01/2013   HDL 34* 08/01/2013   LDLCALC 37 08/01/2013   ALT 25 08/01/2013   AST 20 08/01/2013   NA 142 08/01/2013   K 4.5 08/01/2013   CL 102 08/01/2013   CREATININE 0.84 08/01/2013   BUN 9 08/01/2013   CO2 24 08/01/2013   TSH 1.390 08/01/2013   PSA 1.2 08/01/2013    Dg Chest 2 View  11/07/2009   Clinical Data: Shortness of breath, former smoking history   CHEST - 2 VIEW   Comparison: None.   Findings: The lungs are clear.  No infiltrate or  effusion is seen. Mild peribronchial thickening is noted.  Mediastinal contours are normal.  The heart is within normal limits in size.   IMPRESSION: No active lung disease.  Mild peribronchial thickening and slight hyperaeration.  Provider: Nancy Marus   Assessment & Plan:   Jason Braun was seen today for hypertension, hyperlipidemia and gastrophageal reflux.  Diagnoses and all orders for this visit:  Hyperlipemia Orders: -     NMR, lipoprofile  Essential hypertension, benign Orders: -     CMP14+EGFR  Other fatigue Orders: -     POCT CBC  Other orders -     Discontinue: lansoprazole (PREVACID) 30 MG capsule; Take 2 capsules (60 mg total) by mouth 2 (two) times daily before a meal. -     lansoprazole (PREVACID) 30 MG capsule; Take 1 capsule (30 mg total) by mouth 2 (two) times daily before a meal.   I have discontinued Mr. Lammert's AMBULATORY NON FORMULARY MEDICATION, ciprofloxacin, esomeprazole, and doxycycline. I have also changed his lansoprazole. Additionally, I am having him maintain his aspirin, rosuvastatin, metoprolol tartrate, nitroGLYCERIN, albuterol, and ALPRAZolam.  Meds ordered this encounter  Medications  . DISCONTD: lansoprazole (PREVACID)  30 MG capsule    Sig: Take 2 capsules (60 mg total) by mouth 2 (two) times daily before a meal.    Dispense:  60 capsule    Refill:  5  . lansoprazole (PREVACID) 30 MG capsule    Sig: Take 1 capsule (30 mg total) by mouth 2 (two) times daily before a meal.    Dispense:  60 capsule    Refill:  5    Follow-up: Return in about 3 months (around 08/21/2014) for hypertension, cholesterol.  Claretta Fraise, M.D.

## 2014-05-23 NOTE — Patient Instructions (Addendum)
Patient to have cholesterol checked today along with liver functions.  Patient tells me he is having active reflux will start him on a new reflux medication today. Take lansoprazole 30 mg twice daily. See medication orders.  Since the patient has had a cardiac stent implanted in his mid 78s he should keep his blood pressure at or below 125/75.  Increase the metoprolol to one half pill twice daily.

## 2014-05-24 LAB — CMP14+EGFR
ALBUMIN: 4.3 g/dL (ref 3.5–5.5)
ALT: 31 IU/L (ref 0–44)
AST: 20 IU/L (ref 0–40)
Albumin/Globulin Ratio: 1.7 (ref 1.1–2.5)
Alkaline Phosphatase: 86 IU/L (ref 39–117)
BUN/Creatinine Ratio: 12 (ref 9–20)
BUN: 10 mg/dL (ref 6–24)
Bilirubin Total: 0.4 mg/dL (ref 0.0–1.2)
CO2: 25 mmol/L (ref 18–29)
Calcium: 9.6 mg/dL (ref 8.7–10.2)
Chloride: 103 mmol/L (ref 97–108)
Creatinine, Ser: 0.86 mg/dL (ref 0.76–1.27)
GFR calc non Af Amer: 102 mL/min/{1.73_m2} (ref >59)
GFR, EST AFRICAN AMERICAN: 118 mL/min/{1.73_m2} (ref >59)
GLUCOSE: 106 mg/dL — AB (ref 65–99)
Globulin, Total: 2.5 g/dL (ref 1.5–4.5)
Potassium: 4.6 mmol/L (ref 3.5–5.2)
Sodium: 144 mmol/L (ref 134–144)
TOTAL PROTEIN: 6.8 g/dL (ref 6.0–8.5)

## 2014-05-24 LAB — CBC WITH DIFFERENTIAL/PLATELET
BASOS ABS: 0.1 10*3/uL (ref 0.0–0.2)
Basos: 1 %
Eos: 2 %
Eosinophils Absolute: 0.1 10*3/uL (ref 0.0–0.4)
HCT: 51.2 % — ABNORMAL HIGH (ref 37.5–51.0)
Hemoglobin: 17.8 g/dL — ABNORMAL HIGH (ref 12.6–17.7)
IMMATURE GRANULOCYTES: 0 %
Immature Grans (Abs): 0 10*3/uL (ref 0.0–0.1)
Lymphocytes Absolute: 2.5 10*3/uL (ref 0.7–3.1)
Lymphs: 40 %
MCH: 28.8 pg (ref 26.6–33.0)
MCHC: 34.8 g/dL (ref 31.5–35.7)
MCV: 83 fL (ref 79–97)
MONOCYTES: 6 %
MONOS ABS: 0.4 10*3/uL (ref 0.1–0.9)
NEUTROS ABS: 3.2 10*3/uL (ref 1.4–7.0)
Neutrophils Relative %: 51 %
Platelets: 254 10*3/uL (ref 150–379)
RBC: 6.17 x10E6/uL — AB (ref 4.14–5.80)
RDW: 14.9 % (ref 12.3–15.4)
WBC: 6.3 10*3/uL (ref 3.4–10.8)

## 2014-05-24 LAB — NMR, LIPOPROFILE
CHOLESTEROL: 130 mg/dL (ref 100–199)
HDL Cholesterol by NMR: 37 mg/dL — ABNORMAL LOW (ref >39)
HDL Particle Number: 34.6 umol/L (ref >=30.5)
LDL Particle Number: 1002 nmol/L — ABNORMAL HIGH (ref <1000)
LDL SIZE: 19.9 nm (ref >20.5)
LDL-C: 65 mg/dL (ref 0–99)
LP-IR Score: 85 — ABNORMAL HIGH (ref <=45)
Small LDL Particle Number: 733 nmol/L — ABNORMAL HIGH (ref <=527)
TRIGLYCERIDES BY NMR: 141 mg/dL (ref 0–149)

## 2014-06-21 ENCOUNTER — Encounter: Payer: Self-pay | Admitting: Cardiology

## 2014-06-21 ENCOUNTER — Ambulatory Visit (INDEPENDENT_AMBULATORY_CARE_PROVIDER_SITE_OTHER): Payer: PRIVATE HEALTH INSURANCE | Admitting: Cardiology

## 2014-06-21 VITALS — BP 138/96 | HR 79 | Ht 67.0 in | Wt 202.0 lb

## 2014-06-21 DIAGNOSIS — I10 Essential (primary) hypertension: Secondary | ICD-10-CM | POA: Diagnosis not present

## 2014-06-21 NOTE — Patient Instructions (Signed)
The current medical regimen is effective;  continue present plan and medications.  Follow up in 2 years with Dr. Percival Spanish in [place].  You will receive a letter in the mail 2 months before you are due.  Please call us when you receive this letter to schedule your follow up appointment.  Thank you for choosing [facility]!!

## 2014-06-21 NOTE — Progress Notes (Signed)
HPI The patient presents for followup of known coronary disease. It has been a couple of years since his last appointment. He had a stress perfusion study in 2014 there was negative for any evidence of ischemia.   Since I last saw him he has done well. The patient denies any new symptoms such as chest discomfort, neck or arm discomfort. There has been no new shortness of breath, PND or orthopnea. There have been no reported palpitations, presyncope or syncope.  He walks 1 to 1.5 miles most days.  He does have reflux which causes some dyspnea.  However, he manages this with diet and meds as below.  This has been a stable pattern. If he is not bothered by this on a given day he can exercise without symptoms.  He gets none of the neck pain that was his angina.     Allergies  Allergen Reactions  . Plavix [Clopidogrel Bisulfate] Shortness Of Breath  . Trilipix [Choline Fenofibrate] Shortness Of Breath    Patient stating it made his tongue swell  . Penicillins     Current Outpatient Prescriptions  Medication Sig Dispense Refill  . albuterol (PROVENTIL HFA;VENTOLIN HFA) 108 (90 BASE) MCG/ACT inhaler Inhale 2 puffs into the lungs every 6 (six) hours as needed for wheezing or shortness of breath. 1 Inhaler 0  . aspirin 81 MG tablet Take 81 mg by mouth daily.    Marland Kitchen esomeprazole (NEXIUM) 20 MG capsule Take 20 mg by mouth 2 (two) times daily before a meal.    . metoprolol tartrate (LOPRESSOR) 25 MG tablet Take 0.5 tablets (12.5 mg total) by mouth 2 (two) times daily. 30 tablet 11  . rosuvastatin (CRESTOR) 40 MG tablet TAKE 1/2 TABLET BY MOUTH DAILY 15 tablet 0  . ALPRAZolam (XANAX) 0.5 MG tablet Take 1 tablet (0.5 mg total) by mouth at bedtime as needed for sleep. (Patient not taking: Reported on 06/21/2014) 30 tablet 3  . nitroGLYCERIN (NITROSTAT) 0.4 MG SL tablet Place 1 tablet (0.4 mg total) under the tongue every 5 (five) minutes as needed for chest pain. 25 tablet 0   No current  facility-administered medications for this visit.    Past Medical History  Diagnosis Date  . Hyperlipidemia   . GERD (gastroesophageal reflux disease)   . Anxiety   . Asthma   . CAD (coronary artery disease)     80-90% right coronary artery lesion treated with Taxus stenting 2004. 25% LAD stenosis, 30% circumflex stenosis.    Past Surgical History  Procedure Laterality Date  . Coronary stent placement  02/20/2003  . Inguinal hernia repair      as a child     ROS:  Positive for hiatal hernia, and joint pains.  As stated in the HPI and negative for all other systems.   PHYSICAL EXAM BP 138/96 mmHg  Pulse 79  Ht 5\' 7"  (1.702 m)  Wt 202 lb (91.627 kg)  BMI 31.63 kg/m2 GENERAL:  Well appearing HEENT:  Pupils equal round and reactive, fundi not visualized, oral mucosa unremarkable NECK:  No jugular venous distention, waveform within normal limits, carotid upstroke brisk and symmetric, no bruits, no thyromegaly LYMPHATICS:  No cervical, inguinal adenopathy LUNGS:  Clear to auscultation bilaterally BACK:  No CVA tenderness CHEST:  Unremarkable HEART:  PMI not displaced or sustained,S1 and S2 within normal limits, no S3, no S4, no clicks, no rubs, no murmurs ABD:  Flat, positive bowel sounds normal in frequency in pitch, no bruits, no rebound, no  guarding, no midline pulsatile mass, no hepatomegaly, no splenomegaly EXT:  2 plus pulses throughout, no edema, no cyanosis no clubbing   EKG:  Sinus rhythm, rate 79, axis within normal limits, intervals within normal limits, no acute ST-T wave changes..  06/21/2014   ASSESSMENT AND PLAN  CAD:  The patient has no new sypmtoms.  No further cardiovascular testing is indicated.  We will continue with aggressive risk reduction and meds as listed.  HTN:  The blood pressure is at target. No change in medications is indicated. We will continue with therapeutic lifestyle changes (TLC).  DYSLIPIDEMIA:  His LDL in Feb was 65. He will continue  the meds as listed.

## 2014-06-28 ENCOUNTER — Other Ambulatory Visit: Payer: Self-pay | Admitting: *Deleted

## 2014-06-28 DIAGNOSIS — Z0000 Encounter for general adult medical examination without abnormal findings: Secondary | ICD-10-CM

## 2014-06-28 DIAGNOSIS — R350 Frequency of micturition: Secondary | ICD-10-CM

## 2014-06-28 DIAGNOSIS — R300 Dysuria: Secondary | ICD-10-CM

## 2014-06-28 DIAGNOSIS — J069 Acute upper respiratory infection, unspecified: Secondary | ICD-10-CM

## 2014-06-28 MED ORDER — ALBUTEROL SULFATE HFA 108 (90 BASE) MCG/ACT IN AERS: 2.0000 | INHALATION_SPRAY | Freq: Four times a day (QID) | RESPIRATORY_TRACT | Status: DC | PRN

## 2014-08-03 ENCOUNTER — Other Ambulatory Visit: Payer: Self-pay | Admitting: Family Medicine

## 2014-08-15 ENCOUNTER — Other Ambulatory Visit: Payer: Self-pay | Admitting: Family Medicine

## 2014-08-30 ENCOUNTER — Telehealth: Payer: Self-pay | Admitting: Family Medicine

## 2014-08-30 ENCOUNTER — Other Ambulatory Visit: Payer: Self-pay

## 2014-08-30 DIAGNOSIS — F411 Generalized anxiety disorder: Secondary | ICD-10-CM

## 2014-08-30 MED ORDER — ALPRAZOLAM 0.5 MG PO TABS: 0.5000 mg | ORAL_TABLET | Freq: Every evening | ORAL | Status: DC | PRN

## 2014-08-30 NOTE — Telephone Encounter (Signed)
Please review and advise.

## 2014-08-30 NOTE — Telephone Encounter (Signed)
Rx called into [facility]

## 2014-08-30 NOTE — Telephone Encounter (Signed)
Pt notified that he will nned to be fasting for appt

## 2014-08-30 NOTE — Telephone Encounter (Signed)
Last seen 05/23/14 Dr Livia Snellen  If approved route to nurse to call into [place]  [phone]

## 2014-08-31 ENCOUNTER — Encounter: Payer: Self-pay | Admitting: Family Medicine

## 2014-08-31 ENCOUNTER — Ambulatory Visit (INDEPENDENT_AMBULATORY_CARE_PROVIDER_SITE_OTHER): Payer: PRIVATE HEALTH INSURANCE | Admitting: Family Medicine

## 2014-08-31 VITALS — BP 156/88 | HR 72 | Temp 97.5°F | Ht 67.0 in | Wt 204.8 lb

## 2014-08-31 DIAGNOSIS — E785 Hyperlipidemia, unspecified: Secondary | ICD-10-CM

## 2014-08-31 DIAGNOSIS — I10 Essential (primary) hypertension: Secondary | ICD-10-CM

## 2014-08-31 DIAGNOSIS — I2510 Atherosclerotic heart disease of native coronary artery without angina pectoris: Secondary | ICD-10-CM

## 2014-08-31 DIAGNOSIS — I2583 Coronary atherosclerosis due to lipid rich plaque: Secondary | ICD-10-CM

## 2014-08-31 DIAGNOSIS — J4530 Mild persistent asthma, uncomplicated: Secondary | ICD-10-CM | POA: Diagnosis not present

## 2014-08-31 LAB — POCT CBC
GRANULOCYTE PERCENT: 55.3 % (ref 37–80)
HEMATOCRIT: 53.7 % (ref 43.5–53.7)
HEMOGLOBIN: 17.1 g/dL (ref 14.1–18.1)
Lymph, poc: 2.5 (ref 0.6–3.4)
MCH, POC: 27.2 pg (ref 27–31.2)
MCHC: 31.8 g/dL (ref 31.8–35.4)
MCV: 85.4 fL (ref 80–97)
MPV: 9.3 fL (ref 0–99.8)
POC Granulocyte: 3.4 (ref 2–6.9)
POC LYMPH %: 41.4 % (ref 10–50)
Platelet Count, POC: 263 10*3/uL (ref 142–424)
RBC: 6.29 M/uL — AB (ref 4.69–6.13)
RDW, POC: 13.7 %
WBC: 6.1 10*3/uL (ref 4.6–10.2)

## 2014-08-31 MED ORDER — DILTIAZEM HCL ER COATED BEADS 120 MG PO CP24: 120.0000 mg | ORAL_CAPSULE | Freq: Every day | ORAL | Status: DC

## 2014-08-31 NOTE — Progress Notes (Addendum)
Subjective:  Patient ID: Jason Braun, male    DOB: 02/01/1966  Age: 49 y.o. MRN: [id number]  CC: Hypertension and Hyperlipidemia   HPI Jason Braun presents for follow-up of elevated cholesterol. Doing well without complaints on current medication. Denies side effects of statin including myalgia and arthralgia and nausea. Also in today for liver function testing. Currently no chest pain, shortness of breath or other cardiovascular related symptoms noted.   follow-up of hypertension. Patient has no history of headache chest pain or shortness of breath or recent cough. Patient also denies symptoms of TIA such as numbness weakness lateralizing. Patient checks  blood pressure at home and has not had any elevated readings recently. Jason Braun states his daughter who is a Marine scientist actually brings a monitor over and uses it thus supporting high accuracy. Patient reports side effects from his medication. Patient states Jason Braun has a history of asthma and Jason Braun's concerned about increasing the metoprolol because of the potential for shortness of breath. Jason Braun does not currently take medication for asthma.  History Jason Braun has a past medical history of Hyperlipidemia; GERD (gastroesophageal reflux disease); Anxiety; Asthma; and CAD (coronary artery disease).   Jason Braun has past surgical history that includes Coronary stent placement (02/20/2003) and Inguinal hernia repair.   His family history includes Diabetes in his maternal grandmother; Heart attack (age of onset: 14) in his father; Heart disease in his paternal uncle; Kidney disease in his maternal aunt.Jason Braun reports that Jason Braun quit smoking about 12 years ago. His smoking use included Cigarettes. Jason Braun has quit using smokeless tobacco. His smokeless tobacco use included Snuff. Jason Braun reports that Jason Braun drinks alcohol. Jason Braun reports that Jason Braun does not use illicit drugs.  Current Outpatient Prescriptions on File Prior to Visit  Medication Sig Dispense Refill  . ALPRAZolam  (XANAX) 0.5 MG tablet Take 1 tablet (0.5 mg total) by mouth at bedtime as needed for sleep. 30 tablet 0  . aspirin 81 MG tablet Take 81 mg by mouth daily.    Marland Kitchen esomeprazole (NEXIUM) 20 MG capsule Take 20 mg by mouth 2 (two) times daily before a meal.    . metoprolol tartrate (LOPRESSOR) 25 MG tablet TAKE (1/2) TABLET BY MOUTH TWICE DAILY 30 tablet 2  . nitroGLYCERIN (NITROSTAT) 0.4 MG SL tablet Place 1 tablet (0.4 mg total) under the tongue every 5 (five) minutes as needed for chest pain. 25 tablet 0  . rosuvastatin (CRESTOR) 40 MG tablet TAKE 1/2 TABLET BY MOUTH DAILY 15 tablet 0  . VENTOLIN HFA 108 (90 BASE) MCG/ACT inhaler INHALE 2 PUFFS EVERY 6 HOURS IF NEEDED FOR WHEEZING OR SHORTNESS OF BREATH. 18 g 3   No current facility-administered medications on file prior to visit.    ROS Review of Systems  Constitutional: Negative for fever, chills and diaphoresis.  HENT: Negative for congestion, rhinorrhea and sore throat.   Respiratory: Negative for cough, shortness of breath and wheezing.   Cardiovascular: Negative for chest pain.  Gastrointestinal: Negative for nausea, vomiting, abdominal pain, diarrhea, constipation and abdominal distention.  Genitourinary: Negative for dysuria and frequency.  Musculoskeletal: Negative for joint swelling and arthralgias.  Skin: Negative for rash.  Neurological: Negative for headaches.    Objective:  BP 156/88 mmHg  Pulse 72  Temp(Src) 97.5 F (36.4 C) (Oral)  Ht 5\' 7"  (1.702 m)  Wt 204 lb 12.8 oz (92.897 kg)  BMI 32.07 kg/m2  BP Readings from Last 3 Encounters:  08/31/14 156/88  06/21/14 138/96  05/23/14 154/94  Wt Readings from Last 3 Encounters:  08/31/14 204 lb 12.8 oz (92.897 kg)  06/21/14 202 lb (91.627 kg)  05/23/14 207 lb (93.895 kg)     Physical Exam  Constitutional: Jason Braun is oriented to person, place, and time. Jason Braun appears well-developed and well-nourished. No distress.  HENT:  Head: Normocephalic and atraumatic.  Right Ear:  External ear normal.  Left Ear: External ear normal.  Nose: Nose normal.  Mouth/Throat: Oropharynx is clear and moist.  Eyes: Conjunctivae and EOM are normal. Pupils are equal, round, and reactive to light.  Neck: Normal range of motion. Neck supple. No thyromegaly present.  Cardiovascular: Normal rate, regular rhythm and normal heart sounds.   No murmur heard. Pulmonary/Chest: Effort normal and breath sounds normal. No respiratory distress. Jason Braun has no wheezes. Jason Braun has no rales.  Abdominal: Soft. Bowel sounds are normal. Jason Braun exhibits no distension. There is no tenderness.  Lymphadenopathy:    Jason Braun has no cervical adenopathy.  Neurological: Jason Braun is alert and oriented to person, place, and time. Jason Braun has normal reflexes.  Skin: Skin is warm and dry.  Psychiatric: Jason Braun has a normal mood and affect. His behavior is normal. Judgment and thought content normal.    No results found for: HGBA1C  Lab Results  Component Value Date   WBC 6.3 05/23/2014   HGB 17.8* 05/23/2014   HCT 51.2* 05/23/2014   PLT 254 05/23/2014   GLUCOSE 106* 05/23/2014   CHOL 130 05/23/2014   TRIG 141 05/23/2014   HDL 37* 05/23/2014   LDLCALC 37 08/01/2013   ALT 31 05/23/2014   AST 20 05/23/2014   NA 144 05/23/2014   K 4.6 05/23/2014   CL 103 05/23/2014   CREATININE 0.86 05/23/2014   BUN 10 05/23/2014   CO2 25 05/23/2014   TSH 1.390 08/01/2013   PSA 1.2 08/01/2013    Assessment & Plan:   Jason Braun was seen today for hypertension and hyperlipidemia.  Diagnoses and all orders for this visit:  Essential hypertension, benign  Hyperlipemia  Coronary artery disease due to lipid rich plaque  Asthma, chronic, mild persistent, uncomplicated Orders: -     PR EVAL OF BRONCHOSPASM  I am having Jason Braun maintain his aspirin, rosuvastatin, nitroGLYCERIN, esomeprazole, VENTOLIN HFA, metoprolol tartrate, and ALPRAZolam.  No orders of the defined types were placed in this encounter.     Follow-up: Return in about 3  months (around 12/01/2014) for hypertension.  Claretta Fraise, M.D.

## 2014-08-31 NOTE — Addendum Note (Signed)
Addended by: Marin Olp on: 08/31/2014 04:03 PM   Modules accepted: Orders

## 2014-08-31 NOTE — Addendum Note (Signed)
Addended by: Claretta Fraise on: 08/31/2014 07:22 PM   Modules accepted: Orders, Medications

## 2014-09-01 ENCOUNTER — Telehealth: Payer: Self-pay | Admitting: *Deleted

## 2014-09-01 LAB — CMP14+EGFR
A/G RATIO: 1.6 (ref 1.1–2.5)
ALT: 24 IU/L (ref 0–44)
AST: 19 IU/L (ref 0–40)
Albumin: 4.2 g/dL (ref 3.5–5.5)
Alkaline Phosphatase: 89 IU/L (ref 39–117)
BUN/Creatinine Ratio: 15 (ref 9–20)
BUN: 12 mg/dL (ref 6–24)
Bilirubin Total: 0.4 mg/dL (ref 0.0–1.2)
CO2: 24 mmol/L (ref 18–29)
Calcium: 9.4 mg/dL (ref 8.7–10.2)
Chloride: 102 mmol/L (ref 97–108)
Creatinine, Ser: 0.78 mg/dL (ref 0.76–1.27)
GFR calc Af Amer: 123 mL/min/{1.73_m2} (ref >59)
GFR calc non Af Amer: 106 mL/min/{1.73_m2} (ref >59)
Globulin, Total: 2.7 g/dL (ref 1.5–4.5)
Glucose: 107 mg/dL — ABNORMAL HIGH (ref 65–99)
Potassium: 4.3 mmol/L (ref 3.5–5.2)
Sodium: 140 mmol/L (ref 134–144)
Total Protein: 6.9 g/dL (ref 6.0–8.5)

## 2014-09-01 LAB — LIPID PANEL
Chol/HDL Ratio: 5.2 ratio units — ABNORMAL HIGH (ref 0.0–5.0)
Cholesterol, Total: 166 mg/dL (ref 100–199)
HDL: 32 mg/dL — ABNORMAL LOW (ref >39)
LDL Calculated: 86 mg/dL (ref 0–99)
TRIGLYCERIDES: 240 mg/dL — AB (ref 0–149)
VLDL CHOLESTEROL CAL: 48 mg/dL — AB (ref 5–40)

## 2014-09-01 NOTE — Telephone Encounter (Signed)
-----   Message from Claretta Fraise, MD sent at 09/01/2014 11:42 AM EDT ----- Earney Mallet,    Your lab result is normal.Some minor variations that are not significant are commonly marked abnormal, but do not represent any medical problem for you.  Best regards, Claretta Fraise, M.D.

## 2014-09-01 NOTE — Telephone Encounter (Signed)
Pt notified of results Verbalizes understanding 

## 2014-09-11 ENCOUNTER — Other Ambulatory Visit: Payer: Self-pay | Admitting: Family Medicine

## 2014-10-05 ENCOUNTER — Encounter: Payer: Self-pay | Admitting: Cardiology

## 2014-10-06 ENCOUNTER — Other Ambulatory Visit: Payer: Self-pay

## 2014-10-06 DIAGNOSIS — F411 Generalized anxiety disorder: Secondary | ICD-10-CM

## 2014-10-06 MED ORDER — ALPRAZOLAM 0.5 MG PO TABS: 0.5000 mg | ORAL_TABLET | Freq: Every evening | ORAL | Status: DC | PRN

## 2014-10-06 NOTE — Telephone Encounter (Signed)
Last seen 08/31/14  Dr Livia Snellen  If approved route to nurse to call into [place]  [phone]

## 2014-10-06 NOTE — Telephone Encounter (Signed)
rx called into pharmacy

## 2014-10-06 NOTE — Telephone Encounter (Signed)
Please call in xanax with 1 refills 

## 2014-11-07 ENCOUNTER — Other Ambulatory Visit: Payer: Self-pay

## 2014-11-07 MED ORDER — METOPROLOL TARTRATE 25 MG PO TABS: 25.0000 mg | ORAL_TABLET | Freq: Two times a day (BID) | ORAL | Status: DC

## 2014-11-07 NOTE — Telephone Encounter (Signed)
Last seen 08/31/14 Dr Livia Snellen   This med not on EPIC list

## 2014-12-01 ENCOUNTER — Ambulatory Visit: Payer: PRIVATE HEALTH INSURANCE | Admitting: Family Medicine

## 2015-01-23 ENCOUNTER — Other Ambulatory Visit: Payer: Self-pay

## 2015-01-23 MED ORDER — METOPROLOL TARTRATE 25 MG PO TABS: 25.0000 mg | ORAL_TABLET | Freq: Two times a day (BID) | ORAL | Status: DC

## 2015-01-24 ENCOUNTER — Encounter: Payer: Self-pay | Admitting: Family Medicine

## 2015-02-08 ENCOUNTER — Ambulatory Visit: Payer: PRIVATE HEALTH INSURANCE | Admitting: Family Medicine

## 2015-02-14 ENCOUNTER — Ambulatory Visit: Payer: PRIVATE HEALTH INSURANCE | Admitting: Family Medicine

## 2015-02-16 ENCOUNTER — Ambulatory Visit: Payer: PRIVATE HEALTH INSURANCE | Admitting: Family Medicine

## 2015-02-18 DIAGNOSIS — E785 Hyperlipidemia, unspecified: Secondary | ICD-10-CM | POA: Insufficient documentation

## 2015-02-18 NOTE — Progress Notes (Signed)
Subjective:  Patient ID: Jason Braun, male    DOB: 14-Jul-1965  Age: 49 y.o. MRN: [id number]  CC: Hypertension and hyperlipidemia  HPI Oren Barella presents for  follow-up of hypertension. Patient has no history of headache chest pain or shortness of breath or recent cough. Patient also denies symptoms of TIA such as numbness weakness lateralizing. Patient checks  blood pressure at home and has not had any elevated readings recently. Patient denies side effects from his medication. States taking it regularly. He feels that his pressure is high because of his weight gain. Patient in for follow-up of elevated cholesterol. Doing well without complaints on current medication. Denies side effects of statin including myalgia and arthralgia and nausea. Also in today for liver function testing. Currently no chest pain, shortness of breath or other cardiovascular related symptoms noted. Patient also reports that he is having some shortness of breath with exertion. He feels that some of this is his weight. He would like to try weight loss to help with this but he is using his albuterol multiple times per week.  History Tyrin has a past medical history of Hyperlipidemia; GERD (gastroesophageal reflux disease); Anxiety; Asthma; and CAD (coronary artery disease).   He has past surgical history that includes Coronary stent placement (02/20/2003) and Inguinal hernia repair.   His family history includes Diabetes in his maternal grandmother; Heart attack (age of onset: 44) in his father; Heart disease in his paternal uncle; Kidney disease in his maternal aunt.He reports that he quit smoking about 13 years ago. His smoking use included Cigarettes. He has quit using smokeless tobacco. His smokeless tobacco use included Snuff. He reports that he drinks alcohol. He reports that he does not use illicit drugs.  Current Outpatient Prescriptions on File Prior to Visit  Medication Sig Dispense  Refill  . ALPRAZolam (XANAX) 0.5 MG tablet Take 1 tablet (0.5 mg total) by mouth at bedtime as needed for sleep. 30 tablet 0  . aspirin 81 MG tablet Take 81 mg by mouth daily.    . nitroGLYCERIN (NITROSTAT) 0.4 MG SL tablet Place 1 tablet (0.4 mg total) under the tongue every 5 (five) minutes as needed for chest pain. 25 tablet 0   No current facility-administered medications on file prior to visit.    ROS Review of Systems  Constitutional: Negative for fever, chills and diaphoresis.  HENT: Negative for congestion, rhinorrhea and sore throat.   Respiratory: Negative for cough, shortness of breath and wheezing.   Cardiovascular: Negative for chest pain.  Gastrointestinal: Negative for nausea, vomiting, abdominal pain, diarrhea, constipation and abdominal distention.  Genitourinary: Negative for dysuria and frequency.  Musculoskeletal: Negative for joint swelling and arthralgias.  Skin: Negative for rash.  Neurological: Negative for headaches.    Objective:  BP 151/87 mmHg  Pulse 78  Temp(Src) 97.2 F (36.2 C) (Oral)  Ht _0  (1.702 m)  Wt 212 lb 3.2 oz (96.253 kg)  BMI 33.23 kg/m2  SpO2 98%  BP Readings from Last 3 Encounters:  02/19/15 151/87  08/31/14 156/88  06/21/14 138/96    Wt Readings from Last 3 Encounters:  02/19/15 212 lb 3.2 oz (96.253 kg)  08/31/14 204 lb 12.8 oz (92.897 kg)  06/21/14 202 lb (91.627 kg)     Physical Exam  Constitutional: He is oriented to person, place, and time. He appears well-developed and well-nourished. No distress.  HENT:  Head: Normocephalic and atraumatic.  Right Ear: External ear normal.  Left Ear: External  ear normal.  Nose: Nose normal.  Mouth/Throat: Oropharynx is clear and moist.  Eyes: Conjunctivae and EOM are normal. Pupils are equal, round, and reactive to light.  Neck: Normal range of motion. Neck supple. No thyromegaly present.  Cardiovascular: Normal rate, regular rhythm and normal heart sounds.   No murmur  heard. Pulmonary/Chest: Effort normal and breath sounds normal. No respiratory distress. He has no wheezes. He has no rales.  Abdominal: Soft. Bowel sounds are normal. He exhibits no distension. There is no tenderness.  Lymphadenopathy:    He has no cervical adenopathy.  Neurological: He is alert and oriented to person, place, and time. He has normal reflexes.  Skin: Skin is warm and dry.  Psychiatric: He has a normal mood and affect. His behavior is normal. Judgment and thought content normal.    No results found for: HGBA1C  Lab Results  Component Value Date   WBC 6.1 08/31/2014   HGB 17.1 08/31/2014   HCT 53.7 08/31/2014   PLT 254 05/23/2014   GLUCOSE 107* 08/31/2014   CHOL 166 08/31/2014   TRIG 240* 08/31/2014   HDL 32* 08/31/2014   LDLCALC 86 08/31/2014   ALT 24 08/31/2014   AST 19 08/31/2014   NA 140 08/31/2014   K 4.3 08/31/2014   CL 102 08/31/2014   CREATININE 0.78 08/31/2014   BUN 12 08/31/2014   CO2 24 08/31/2014   TSH 1.390 08/01/2013   PSA 1.2 08/01/2013    Dg Chest 2 View  11/07/2009  Clinical Data: Shortness of breath, former smoking history  CHEST - 2 VIEW  Comparison: None.  Findings: The lungs are clear.  No infiltrate or effusion is seen. Mild peribronchial thickening is noted.  Mediastinal contours are normal.  The heart is within normal limits in size.  IMPRESSION: No active lung disease.  Mild peribronchial thickening and slight hyperaeration. Provider: Nancy Marus   Assessment & Plan:   Jaquin was seen today for hypertension and hyperlipidemia.  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 -     rosuvastatin (CRESTOR) 40 MG tablet; Take 0.5 tablets (20 mg total) by mouth daily. -     esomeprazole (NEXIUM) 20 MG capsule; Take 1 capsule (20 mg total) by mouth 2 (two) times daily before a meal. -     metoprolol tartrate (LOPRESSOR) 25 MG tablet; Take 1 tablet (25 mg total) by mouth 2 (two) times daily. -     albuterol (VENTOLIN HFA) 108 (90 BASE) MCG/ACT inhaler; Inhale 1-2 puffs into the lungs every 6 (six) hours as needed for wheezing  or shortness of breath.   I have discontinued Mr. Seher's diltiazem. I have changed his CRESTOR to rosuvastatin and VENTOLIN HFA to albuterol. I have also changed his esomeprazole. Additionally, I am having him maintain his aspirin, nitroGLYCERIN, ALPRAZolam, and metoprolol tartrate.  Meds ordered this encounter  Medications  . rosuvastatin (CRESTOR) 40 MG tablet    Sig: Take 0.5 tablets (20 mg total) by mouth daily.    Dispense:  15 tablet    Refill:  5  . esomeprazole (NEXIUM) 20 MG capsule    Sig: Take 1 capsule (20 mg total) by mouth 2 (two) times daily before a meal.    Dispense:  60 capsule    Refill:  11  . metoprolol tartrate (LOPRESSOR) 25 MG tablet    Sig: Take 1 tablet (25 mg total) by mouth 2 (two) times daily.    Dispense:  180 tablet    Refill:  3  . albuterol (VENTOLIN HFA) 108 (90 BASE) MCG/ACT inhaler    Sig: Inhale 1-2 puffs into the lungs every 6 (six) hours as needed for wheezing or shortness of breath.    Dispense:  18 g    Refill:  3     Follow-up: Return in about 3 months (around 05/22/2015) for  COPD, hypertension.  Claretta Fraise, M.D.

## 2015-02-19 ENCOUNTER — Other Ambulatory Visit: Payer: Commercial Managed Care - HMO

## 2015-02-19 ENCOUNTER — Ambulatory Visit (INDEPENDENT_AMBULATORY_CARE_PROVIDER_SITE_OTHER): Payer: Commercial Managed Care - HMO | Admitting: Family Medicine

## 2015-02-19 ENCOUNTER — Encounter: Payer: Self-pay | Admitting: Family Medicine

## 2015-02-19 VITALS — BP 151/87 | HR 78 | Temp 97.2°F | Ht 67.0 in | Wt 212.2 lb

## 2015-02-19 DIAGNOSIS — E785 Hyperlipidemia, unspecified: Secondary | ICD-10-CM | POA: Diagnosis not present

## 2015-02-19 DIAGNOSIS — K2100 Gastro-esophageal reflux disease with esophagitis, without bleeding: Secondary | ICD-10-CM

## 2015-02-19 DIAGNOSIS — I25708 Atherosclerosis of coronary artery bypass graft(s), unspecified, with other forms of angina pectoris: Secondary | ICD-10-CM | POA: Diagnosis not present

## 2015-02-19 DIAGNOSIS — I10 Essential (primary) hypertension: Secondary | ICD-10-CM | POA: Diagnosis not present

## 2015-02-19 DIAGNOSIS — J449 Chronic obstructive pulmonary disease, unspecified: Secondary | ICD-10-CM | POA: Insufficient documentation

## 2015-02-19 DIAGNOSIS — J431 Panlobular emphysema: Secondary | ICD-10-CM

## 2015-02-19 DIAGNOSIS — F411 Generalized anxiety disorder: Secondary | ICD-10-CM

## 2015-02-19 MED ORDER — ALBUTEROL SULFATE HFA 108 (90 BASE) MCG/ACT IN AERS: 1.0000 | INHALATION_SPRAY | Freq: Four times a day (QID) | RESPIRATORY_TRACT | Status: AC | PRN

## 2015-02-19 MED ORDER — ESOMEPRAZOLE MAGNESIUM 20 MG PO CPDR: 20.0000 mg | DELAYED_RELEASE_CAPSULE | Freq: Two times a day (BID) | ORAL | Status: DC

## 2015-02-19 MED ORDER — METOPROLOL TARTRATE 25 MG PO TABS: 25.0000 mg | ORAL_TABLET | Freq: Two times a day (BID) | ORAL | Status: AC

## 2015-02-19 MED ORDER — ROSUVASTATIN CALCIUM 40 MG PO TABS: 20.0000 mg | ORAL_TABLET | Freq: Every day | ORAL | Status: DC

## 2015-02-19 NOTE — Patient Instructions (Signed)
DASH Eating Plan  DASH stands for "Dietary Approaches to Stop Hypertension." The DASH eating plan is a healthy eating plan that has been shown to reduce high blood pressure (hypertension). Additional health benefits may include reducing the risk of type 2 diabetes mellitus, heart disease, and stroke. The DASH eating plan may also help with weight loss.  WHAT DO I NEED TO KNOW ABOUT THE DASH EATING PLAN?  For the DASH eating plan, you will follow these general guidelines:  · Choose foods with a percent daily value for sodium of less than 5% (as listed on the food label).  · Use salt-free seasonings or herbs instead of table salt or sea salt.  · Check with your health care provider or pharmacist before using salt substitutes.  · Eat lower-sodium products, often labeled as "lower sodium" or "no salt added."  · Eat fresh foods.  · Eat more vegetables, fruits, and low-fat dairy products.  · Choose whole grains. Look for the word "whole" as the first word in the ingredient list.  · Choose fish and skinless chicken or turkey more often than red meat. Limit fish, poultry, and meat to 6 oz (170 g) each day.  · Limit sweets, desserts, sugars, and sugary drinks.  · Choose heart-healthy fats.  · Limit cheese to 1 oz (28 g) per day.  · Eat more home-cooked food and less restaurant, buffet, and fast food.  · Limit fried foods.  · Cook foods using methods other than frying.  · Limit canned vegetables. If you do use them, rinse them well to decrease the sodium.  · When eating at a restaurant, ask that your food be prepared with less salt, or no salt if possible.  WHAT FOODS CAN I EAT?  Seek help from a dietitian for individual calorie needs.  Grains  Whole grain or whole wheat bread. Brown rice. Whole grain or whole wheat pasta. Quinoa, bulgur, and whole grain cereals. Low-sodium cereals. Corn or whole wheat flour tortillas. Whole grain cornbread. Whole grain crackers. Low-sodium crackers.  Vegetables  Fresh or frozen vegetables  (raw, steamed, roasted, or grilled). Low-sodium or reduced-sodium tomato and vegetable juices. Low-sodium or reduced-sodium tomato sauce and paste. Low-sodium or reduced-sodium canned vegetables.   Fruits  All fresh, canned (in natural juice), or frozen fruits.  Meat and Other Protein Products  Ground beef (85% or leaner), grass-fed beef, or beef trimmed of fat. Skinless chicken or turkey. Ground chicken or turkey. Pork trimmed of fat. All fish and seafood. Eggs. Dried beans, peas, or lentils. Unsalted nuts and seeds. Unsalted canned beans.  Dairy  Low-fat dairy products, such as skim or 1% milk, 2% or reduced-fat cheeses, low-fat ricotta or cottage cheese, or plain low-fat yogurt. Low-sodium or reduced-sodium cheeses.  Fats and Oils  Tub margarines without trans fats. Light or reduced-fat mayonnaise and salad dressings (reduced sodium). Avocado. Safflower, olive, or canola oils. Natural peanut or almond butter.  Other  Unsalted popcorn and pretzels.  The items listed above may not be a complete list of recommended foods or beverages. Contact your dietitian for more options.  WHAT FOODS ARE NOT RECOMMENDED?  Grains  White bread. White pasta. White rice. Refined cornbread. Bagels and croissants. Crackers that contain trans fat.  Vegetables  Creamed or fried vegetables. Vegetables in a cheese sauce. Regular canned vegetables. Regular canned tomato sauce and paste. Regular tomato and vegetable juices.  Fruits  Dried fruits. Canned fruit in light or heavy syrup. Fruit juice.  Meat and Other Protein   Products  Fatty cuts of meat. Ribs, chicken wings, bacon, sausage, bologna, salami, chitterlings, fatback, hot dogs, bratwurst, and packaged luncheon meats. Salted nuts and seeds. Canned beans with salt.  Dairy  Whole or 2% milk, cream, half-and-half, and cream cheese. Whole-fat or sweetened yogurt. Full-fat cheeses or blue cheese. Nondairy creamers and whipped toppings. Processed cheese, cheese spreads, or cheese  curds.  Condiments  Onion and garlic salt, seasoned salt, table salt, and sea salt. Canned and packaged gravies. Worcestershire sauce. Tartar sauce. Barbecue sauce. Teriyaki sauce. Soy sauce, including reduced sodium. Steak sauce. Fish sauce. Oyster sauce. Cocktail sauce. Horseradish. Ketchup and mustard. Meat flavorings and tenderizers. Bouillon cubes. Hot sauce. Tabasco sauce. Marinades. Taco seasonings. Relishes.  Fats and Oils  Butter, stick margarine, lard, shortening, ghee, and bacon fat. Coconut, palm kernel, or palm oils. Regular salad dressings.  Other  Pickles and olives. Salted popcorn and pretzels.  The items listed above may not be a complete list of foods and beverages to avoid. Contact your dietitian for more information.  WHERE CAN I FIND MORE INFORMATION?  National Heart, Lung, and [employer] Institute: www.nhlbi.nih.gov/health/health-topics/topics/dash/     This information is not intended to replace advice given to you by your health care provider. Make sure you discuss any questions you have with your health care provider.     Document Released: 03/13/2011 Document Revised: 04/14/2014 Document Reviewed: 01/26/2013  Elsevier Interactive Patient Education ©2016 [employer].

## 2015-02-20 ENCOUNTER — Other Ambulatory Visit: Payer: Self-pay | Admitting: Family Medicine

## 2015-02-20 LAB — LIPID PANEL
Chol/HDL Ratio: 3.3 ratio units (ref 0.0–5.0)
Cholesterol, Total: 123 mg/dL (ref 100–199)
HDL: 37 mg/dL — ABNORMAL LOW (ref >39)
LDL Calculated: 54 mg/dL (ref 0–99)
Triglycerides: 158 mg/dL — ABNORMAL HIGH (ref 0–149)
VLDL Cholesterol Cal: 32 mg/dL (ref 5–40)

## 2015-02-20 LAB — CMP14+EGFR
ALT: 32 IU/L (ref 0–44)
AST: 23 IU/L (ref 0–40)
Albumin/Globulin Ratio: 2 (ref 1.1–2.5)
Albumin: 4.4 g/dL (ref 3.5–5.5)
Alkaline Phosphatase: 80 IU/L (ref 39–117)
BUN/Creatinine Ratio: 11 (ref 9–20)
BUN: 9 mg/dL (ref 6–24)
Bilirubin Total: 0.4 mg/dL (ref 0.0–1.2)
CO2: 26 mmol/L (ref 18–29)
Calcium: 9.4 mg/dL (ref 8.7–10.2)
Chloride: 104 mmol/L (ref 97–106)
Creatinine, Ser: 0.83 mg/dL (ref 0.76–1.27)
GFR calc Af Amer: 119 mL/min/{1.73_m2} (ref >59)
GFR calc non Af Amer: 103 mL/min/{1.73_m2} (ref >59)
Globulin, Total: 2.2 g/dL (ref 1.5–4.5)
Glucose: 115 mg/dL — ABNORMAL HIGH (ref 65–99)
Potassium: 4.6 mmol/L (ref 3.5–5.2)
Sodium: 144 mmol/L (ref 136–144)
Total Protein: 6.6 g/dL (ref 6.0–8.5)

## 2015-02-20 LAB — CBC WITH DIFFERENTIAL/PLATELET
Basophils Absolute: 0.1 10*3/uL (ref 0.0–0.2)
Basos: 1 %
EOS (ABSOLUTE): 0.2 10*3/uL (ref 0.0–0.4)
Eos: 2 %
Hematocrit: 51.7 % — ABNORMAL HIGH (ref 37.5–51.0)
Hemoglobin: 17.8 g/dL — ABNORMAL HIGH (ref 12.6–17.7)
Immature Grans (Abs): 0 10*3/uL (ref 0.0–0.1)
Immature Granulocytes: 0 %
Lymphocytes Absolute: 2.5 10*3/uL (ref 0.7–3.1)
Lymphs: 37 %
MCH: 29.9 pg (ref 26.6–33.0)
MCHC: 34.4 g/dL (ref 31.5–35.7)
MCV: 87 fL (ref 79–97)
Monocytes Absolute: 0.5 10*3/uL (ref 0.1–0.9)
Monocytes: 7 %
Neutrophils Absolute: 3.5 10*3/uL (ref 1.4–7.0)
Neutrophils: 53 %
Platelets: 220 10*3/uL (ref 150–379)
RBC: 5.95 x10E6/uL — ABNORMAL HIGH (ref 4.14–5.80)
RDW: 14.1 % (ref 12.3–15.4)
WBC: 6.7 10*3/uL (ref 3.4–10.8)

## 2015-02-20 LAB — VITAMIN D 25 HYDROXY (VIT D DEFICIENCY, FRACTURES): Vit D, 25-Hydroxy: 23.9 ng/mL — ABNORMAL LOW (ref 30.0–100.0)

## 2015-02-20 MED ORDER — PANTOPRAZOLE SODIUM 40 MG PO TBEC: 40.0000 mg | DELAYED_RELEASE_TABLET | Freq: Two times a day (BID) | ORAL | Status: DC

## 2015-02-21 NOTE — Progress Notes (Signed)
Patient informed. 

## 2015-05-22 ENCOUNTER — Ambulatory Visit: Payer: Commercial Managed Care - HMO | Admitting: Family Medicine

## 2015-05-23 ENCOUNTER — Telehealth: Payer: Self-pay | Admitting: Family Medicine

## 2015-05-23 NOTE — Telephone Encounter (Signed)
Patients wife offered to make appointment but she states that she will call back.

## 2015-05-23 NOTE — Telephone Encounter (Signed)
Patients wife aware that he will have to be seen.

## 2015-06-07 ENCOUNTER — Other Ambulatory Visit: Payer: Self-pay

## 2015-06-07 DIAGNOSIS — F411 Generalized anxiety disorder: Secondary | ICD-10-CM

## 2015-06-07 NOTE — Telephone Encounter (Signed)
Last seen 02/19/15 Dr Livia Snellen  If approved route to nurse to call into [facility]   [phone]

## 2015-06-08 MED ORDER — ALPRAZOLAM 0.5 MG PO TABS: 0.5000 mg | ORAL_TABLET | Freq: Every evening | ORAL | Status: AC | PRN

## 2015-06-08 NOTE — Telephone Encounter (Signed)
Please review and advise.

## 2015-06-08 NOTE — Telephone Encounter (Signed)
RX for Xanax called into [facility] per Dr Livia Snellen

## 2015-06-18 ENCOUNTER — Ambulatory Visit: Payer: Commercial Managed Care - HMO | Admitting: Family Medicine

## 2016-03-07 ENCOUNTER — Telehealth: Payer: Self-pay | Admitting: Gastroenterology

## 2016-03-07 ENCOUNTER — Encounter: Payer: Self-pay | Admitting: Gastroenterology

## 2016-03-07 NOTE — Telephone Encounter (Signed)
Referral received to sch pt colon.. Last colon 2014, but no recall or letter sent. Placed on Sheri's desk for review

## 2016-05-23 ENCOUNTER — Other Ambulatory Visit: Payer: Self-pay

## 2016-05-30 ENCOUNTER — Encounter: Payer: PRIVATE HEALTH INSURANCE | Admitting: Gastroenterology

## 2016-06-06 ENCOUNTER — Ambulatory Visit (AMBULATORY_SURGERY_CENTER): Payer: Self-pay

## 2016-06-06 VITALS — Ht 67.0 in | Wt 211.0 lb

## 2016-06-06 DIAGNOSIS — Z1211 Encounter for screening for malignant neoplasm of colon: Secondary | ICD-10-CM

## 2016-06-06 MED ORDER — NA SULFATE-K SULFATE-MG SULF 17.5-3.13-1.6 GM/177ML PO SOLN: 1.0000 | Freq: Once | ORAL | 0 refills | Status: AC

## 2016-06-06 NOTE — Progress Notes (Signed)
Denies allergies to eggs or soy products. Denies complication of anesthesia or sedation. Denies use of weight loss medication. Denies use of O2.   Emmi instructions declined.  

## 2016-06-09 ENCOUNTER — Encounter: Payer: Self-pay | Admitting: Gastroenterology

## 2016-06-20 ENCOUNTER — Encounter: Payer: Self-pay | Admitting: Gastroenterology

## 2016-06-20 ENCOUNTER — Ambulatory Visit (AMBULATORY_SURGERY_CENTER): Payer: Managed Care, Other (non HMO) | Admitting: Gastroenterology

## 2016-06-20 VITALS — BP 117/84 | HR 68 | Temp 99.1°F | Resp 16 | Ht 67.0 in | Wt 211.0 lb

## 2016-06-20 DIAGNOSIS — D123 Benign neoplasm of transverse colon: Secondary | ICD-10-CM | POA: Diagnosis not present

## 2016-06-20 DIAGNOSIS — Z1211 Encounter for screening for malignant neoplasm of colon: Secondary | ICD-10-CM

## 2016-06-20 DIAGNOSIS — Z1212 Encounter for screening for malignant neoplasm of rectum: Secondary | ICD-10-CM

## 2016-06-20 MED ORDER — SODIUM CHLORIDE 0.9 % IV SOLN: 500.0000 mL | INTRAVENOUS | Status: AC

## 2016-06-20 NOTE — Progress Notes (Signed)
Pt's states no medical or surgical changes since previsit or office visit. maw 

## 2016-06-20 NOTE — Op Note (Signed)
[facility] Patient Name: Jason Braun Procedure Date: 06/20/2016 11:19 AM MRN: [id number] Endoscopist: Ladene Artist , MD Age: 51 Referring MD:  Date of Birth: 1965/12/31 Gender: Male Account #: [id number] Procedure:                Colonoscopy Indications:              Screening for colorectal malignant neoplasm, This                            is the patient's first colonoscopy Medicines:                Monitored Anesthesia Care Procedure:                Pre-Anesthesia Assessment:                           - Prior to the procedure, a History and Physical                            was performed, and patient medications and                            allergies were reviewed. The patient's tolerance of                            previous anesthesia was also reviewed. The risks                            and benefits of the procedure and the sedation                            options and risks were discussed with the patient.                            All questions were answered, and informed consent                            was obtained. Prior Anticoagulants: The patient has                            taken no previous anticoagulant or antiplatelet                            agents. ASA Grade Assessment: II - A patient with                            mild systemic disease. After reviewing the risks                            and benefits, the patient was deemed in                            satisfactory condition to undergo the procedure.  After obtaining informed consent, the colonoscope                            was passed under direct vision. Throughout the                            procedure, the patient's blood pressure, pulse, and                            oxygen saturations were monitored continuously. The                            Colonoscope was introduced through the anus and                            advanced to the the cecum,  identified by                            appendiceal orifice and ileocecal valve. The                            ileocecal valve, appendiceal orifice, and rectum                            were photographed. The quality of the bowel                            preparation was good. The colonoscopy was performed                            without difficulty. The patient tolerated the                            procedure well. Scope In: 11:26:01 AM Scope Out: 11:37:24 AM Scope Withdrawal Time: 0 hours 9 minutes 54 seconds  Total Procedure Duration: 0 hours 11 minutes 23 seconds  Findings:                 The perianal and digital rectal examinations were                            normal.                           A 7 mm polyp was found in the transverse colon. The                            polyp was sessile. The polyp was removed with a                            cold snare. Resection and retrieval were complete.                           A 4 mm polyp was found in the transverse colon. The  polyp was sessile. The polyp was removed with a                            cold biopsy forceps. Resection and retrieval were                            complete.                           Many medium-mouthed diverticula were found in the                            left colon. There was evidence of diverticular                            spasm.                           Scattered small-mouthed diverticula were found in                            the right colon.                           Internal hemorrhoids were found during                            retroflexion. The hemorrhoids were small and Grade                            I (internal hemorrhoids that do not prolapse).                           The exam was otherwise without abnormality on                            direct and retroflexion views. Complications:            No immediate complications. Estimated blood loss:                             None. Estimated Blood Loss:     Estimated blood loss: none. Impression:               - One 7 mm polyp in the transverse colon, removed                            with a cold snare. Resected and retrieved.                           - One 4 mm polyp in the transverse colon, removed                            with a cold biopsy forceps. Resected and retrieved.                           -  Moderate diverticulosis in the left colon.                           - Mild diverticulosis in the right colon.                           - Internal hemorrhoids.                           - The examination was otherwise normal on direct                            and retroflexion views. Recommendation:           - Repeat colonoscopy in 5 years for surveillance if                            polyp(s) are precancerous, otherwise 10 years for                            screening.                           - Patient has a contact number available for                            emergencies. The signs and symptoms of potential                            delayed complications were discussed with the                            patient. Return to normal activities tomorrow.                            Written discharge instructions were provided to the                            patient.                           - Continue present medications.                           - Await pathology results.                           - High fiber diet indefinitely. Ladene Artist, MD 06/20/2016 11:42:12 AM This report has been signed electronically.

## 2016-06-20 NOTE — Progress Notes (Signed)
Called to room to assist during endoscopic procedure.  Patient ID and intended procedure confirmed with present staff. Received instructions for my participation in the procedure from the performing physician.  

## 2016-06-20 NOTE — Progress Notes (Signed)
Report to PACU, RN, vss, BBS= Clear.  

## 2016-06-20 NOTE — Patient Instructions (Signed)
YOU HAD AN ENDOSCOPIC PROCEDURE TODAY AT [facility]:   Refer to the procedure report that was given to you for any specific questions about what was found during the examination.  If the procedure report does not answer your questions, please call your gastroenterologist to clarify.  If you requested that your care partner not be given the details of your procedure findings, then the procedure report has been included in a sealed envelope for you to review at your convenience later.  YOU SHOULD EXPECT: Some feelings of bloating in the abdomen. Passage of more gas than usual.  Walking can help get rid of the air that was put into your GI tract during the procedure and reduce the bloating. If you had a lower endoscopy (such as a colonoscopy or flexible sigmoidoscopy) you may notice spotting of blood in your stool or on the toilet paper. If you underwent a bowel prep for your procedure, you may not have a normal bowel movement for a few days.  Please Note:  You might notice some irritation and congestion in your nose or some drainage.  This is from the oxygen used during your procedure.  There is no need for concern and it should clear up in a day or so.  SYMPTOMS TO REPORT IMMEDIATELY:   Following lower endoscopy (colonoscopy or flexible sigmoidoscopy):  Excessive amounts of blood in the stool  Significant tenderness or worsening of abdominal pains  Swelling of the abdomen that is new, acute  Fever of 100F or higher   Following upper endoscopy (EGD)  Vomiting of blood or coffee ground material  New chest pain or pain under the shoulder blades  Painful or persistently difficult swallowing  New shortness of breath  Fever of 100F or higher  Black, tarry-looking stools  For urgent or emergent issues, a gastroenterologist can be reached at any hour by calling [phone].   DIET:  We do recommend a small meal at first, but then you may proceed to your regular diet.  Drink  plenty of fluids but you should avoid alcoholic beverages for 24 hours.Try to increase the fiber in your diet, and drink plenty of water.  ACTIVITY:  You should plan to take it easy for the rest of today and you should NOT DRIVE or use heavy machinery until tomorrow (because of the sedation medicines used during the test).    FOLLOW UP: Our staff will call the number listed on your records the next business day following your procedure to check on you and address any questions or concerns that you may have regarding the information given to you following your procedure. If we do not reach you, we will leave a message.  However, if you are feeling well and you are not experiencing any problems, there is no need to return our call.  We will assume that you have returned to your regular daily activities without incident.  If any biopsies were taken you will be contacted by phone or by letter within the next 1-3 weeks.  Please call us at [phone] if you have not heard about the biopsies in 3 weeks.    SIGNATURES/CONFIDENTIALITY: You and/or your care partner have signed paperwork which will be entered into your electronic medical record.  These signatures attest to the fact that that the information above on your After Visit Summary has been reviewed and is understood.  Full responsibility of the confidentiality of this discharge information lies with you and/or your care-partner.  Read all of the handouts given to you by your recovery room nurse.

## 2016-06-23 ENCOUNTER — Telehealth: Payer: Self-pay | Admitting: *Deleted

## 2016-06-23 NOTE — Telephone Encounter (Signed)
  Follow up Call-  Call back number 06/20/2016  Post procedure Call Back phone  # [phone] cell  Permission to leave phone message Yes  Some recent data might be hidden     Patient questions:  Do you have a fever, pain , or abdominal swelling? No. Pain Score  0 *  Have you tolerated food without any problems? Yes.    Have you been able to return to your normal activities? Yes.    Do you have any questions about your discharge instructions: Diet   No. Medications  No. Follow up visit  No.  Do you have questions or concerns about your Care? No.  Actions: * If pain score is 4 or above: No action needed, pain <4.

## 2016-07-04 ENCOUNTER — Encounter: Payer: Self-pay | Admitting: Gastroenterology

## 2017-03-04 NOTE — Progress Notes (Signed)
Cardiology Office Note   Date:  03/05/2017   ID:  Verner Mccrone, DOB 05-Nov-1965, MRN [id number]  PCP:  Marcelyn Bruins, PA-C  Cardiologist:   Minus Breeding, MD    Chief Complaint  Patient presents with  . Coronary Artery Disease      History of Present Illness: Jason Braun is a 51 y.o. male who presents for evaluation of CAD.  He had stenting in 2004.   He had a perfusion study in 2014.  This was negative for evidence of ischemia.  I last saw him in 2016.  Since I last saw him he has done well.  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  The patient denies any new symptoms such as chest discomfort, neck or arm discomfort. There has been no new shortness of breath, PND or orthopnea. There have been no reported palpitations, presyncope or syncope.  He has had non of the symptoms that prompted his previous evaluation.  Of note he has not tolerated Norvasc which was his most recent BP med and he stopped taking this some time ago.     Past Medical History:  Diagnosis Date  . Allergy   . Anxiety   . Asthma    has albuteral inhaler - rarely uses  . CAD (coronary artery disease)    80-90% right coronary artery lesion treated with Taxus stenting 2004. 25% LAD stenosis, 30% circumflex stenosis.  Marland Kitchen GERD (gastroesophageal reflux disease)   . Hyperlipidemia   . Hypertension     Past Surgical History:  Procedure Laterality Date  . CORONARY STENT PLACEMENT  02/20/2003  . INGUINAL HERNIA REPAIR     as a child  . UPPER GASTROINTESTINAL ENDOSCOPY       Current Outpatient Medications  Medication Sig Dispense Refill  . albuterol (VENTOLIN HFA) 108 (90 BASE) MCG/ACT inhaler Inhale 1-2 puffs into the lungs every 6 (six) hours as needed for wheezing or shortness of breath. 18 g 3  . ALPRAZolam (XANAX) 0.5 MG tablet Take 1 tablet (0.5 mg total) by mouth at bedtime as needed for sleep. 30 tablet 0  .  aspirin 81 MG tablet Take 81 mg by mouth daily.    Marland Kitchen guaiFENesin (MUCINEX) 600 MG 12 hr tablet Take by mouth 2 (two) times daily.    . metoprolol tartrate (LOPRESSOR) 25 MG tablet Take 1 tablet (25 mg total) by mouth 2 (two) times daily. 180 tablet 3  . nitroGLYCERIN (NITROSTAT) 0.4 MG SL tablet Place 1 tablet (0.4 mg total) under the tongue every 5 (five) minutes as needed for chest pain. 25 tablet 0  . omeprazole (PRILOSEC) 40 MG capsule Take 40 mg by mouth daily.    . chlorthalidone (HYGROTON) 25 MG tablet Take 1 tablet (25 mg total) by mouth daily. 30 tablet 6  . rosuvastatin (CRESTOR) 20 MG tablet Take 1 tablet (20 mg total) by mouth daily. 30 tablet 6   Current Facility-Administered Medications  Medication Dose Route Frequency Provider Last Rate Last Dose  . 0.9 %  sodium chloride infusion  500 mL Intravenous Continuous Ladene Artist, MD        Allergies:   Lisinopril; Plavix [clopidogrel bisulfate]; Trilipix [choline fenofibrate]; and Penicillins    ROS:  Please see the history of present illness.   Otherwise, review of systems are positive for hand swelling and shoulder pain.   All other systems are reviewed and  negative.    PHYSICAL EXAM: VS:  BP (!) 170/82   Pulse 86   Ht 5\' 7"  (1.702 m)   Wt 215 lb (97.5 kg)   SpO2 96%   BMI 33.67 kg/m  , BMI Body mass index is 33.67 kg/m. GENERAL:  Well appearing HEENT:  Pupils equal round and reactive, fundi not visualized, oral mucosa unremarkable NECK:  No jugular venous distention, waveform within normal limits, carotid upstroke brisk and symmetric, no bruits, no thyromegaly LYMPHATICS:  No cervical, inguinal adenopathy LUNGS:  Clear to auscultation bilaterally BACK:  No CVA tenderness CHEST:  Unremarkable HEART:  PMI not displaced or sustained,S1 and S2 within normal limits, no S3, no S4, no clicks, no rubs, no murmurs ABD:  Flat, positive bowel sounds normal in frequency in pitch, no bruits, no rebound, no guarding, no midline  pulsatile mass, no hepatomegaly, no splenomegaly EXT:  2 plus pulses throughout, no edema, no cyanosis no clubbing SKIN:  No rashes no nodules NEURO:  Cranial nerves II through XII grossly intact, motor grossly intact throughout PSYCH:  Cognitively intact, oriented to person place and time    EKG:  EKG is ordered today. The ekg ordered today demonstrates sinus rhythm, rate 83, axis within normal limits, incomplete right bundle branch block, no acute ST-T wave changes.   Recent Labs: No results found for requested labs within last 8760 hours.      Wt Readings from Last 3 Encounters:  03/05/17 215 lb (97.5 kg)  06/20/16 211 lb (95.7 kg)  06/06/16 211 lb (95.7 kg)      Other studies Reviewed: Additional studies/ records that were reviewed today include: Labs. Review of the above records demonstrates:  Please see elsewhere in the note.     ASSESSMENT AND PLAN:   CAD:  The patient has no new sypmtoms.  No further cardiovascular testing is indicated.  We will continue with aggressive risk reduction and meds as listed.  HTN: His blood pressure is elevated and he has not tolerated certain medications but he agrees to try chlorthalidone.  He knows he needs weight loss and diet change and we talked about specifics of this.  DYSLIPIDEMIA: His last LDL was not at target and I reviewed labs available in Care Everywhere.  His LDL was 102 with an HDL of 34.  I will switch him to Crestor 20 mg daily.  Current medicines are reviewed at length with the patient today.  The patient does not have concerns regarding medicines.  The following changes have been made:  As above  Labs/ tests ordered today include:   Orders Placed This Encounter  Procedures  . EKG 12-Lead     Disposition:   FU with me in six months.     Signed, Minus Breeding, MD  03/05/2017 4:35 PM    [facility]

## 2017-03-05 ENCOUNTER — Ambulatory Visit (INDEPENDENT_AMBULATORY_CARE_PROVIDER_SITE_OTHER): Payer: 59 | Admitting: Cardiology

## 2017-03-05 ENCOUNTER — Encounter: Payer: Self-pay | Admitting: Cardiology

## 2017-03-05 ENCOUNTER — Other Ambulatory Visit: Payer: Self-pay

## 2017-03-05 VITALS — BP 170/82 | HR 86 | Ht 67.0 in | Wt 215.0 lb

## 2017-03-05 DIAGNOSIS — I10 Essential (primary) hypertension: Secondary | ICD-10-CM

## 2017-03-05 DIAGNOSIS — E785 Hyperlipidemia, unspecified: Secondary | ICD-10-CM | POA: Diagnosis not present

## 2017-03-05 DIAGNOSIS — E669 Obesity, unspecified: Secondary | ICD-10-CM

## 2017-03-05 DIAGNOSIS — I25708 Atherosclerosis of coronary artery bypass graft(s), unspecified, with other forms of angina pectoris: Secondary | ICD-10-CM

## 2017-03-05 MED ORDER — ROSUVASTATIN CALCIUM 20 MG PO TABS: 20.0000 mg | ORAL_TABLET | Freq: Every day | ORAL | 6 refills | Status: DC

## 2017-03-05 MED ORDER — CHLORTHALIDONE 25 MG PO TABS: 25.0000 mg | ORAL_TABLET | Freq: Every day | ORAL | 6 refills | Status: DC

## 2017-03-05 NOTE — Patient Instructions (Addendum)
Medication Instructions:   Stop Pravastatin.   Begin Crestor 20mg  daily.   Begin Chlorthalidone 25mg  daily.   Labwork: none  Testing/Procedures: none  Follow-Up: Your physician wants you to follow up in: 6 months.  You will receive a reminder letter in the mail one-two months in advance.  If you don't receive a letter, please call our office to schedule the follow up appointment   Any Other Special Instructions Will Be Listed Below (If Applicable).  If you need a refill on your cardiac medications before your next appointment, please call your pharmacy.

## 2017-08-31 ENCOUNTER — Other Ambulatory Visit: Payer: Self-pay | Admitting: Cardiology

## 2017-09-01 NOTE — Telephone Encounter (Signed)
Rx sent to pharmacy   

## 2017-09-24 ENCOUNTER — Ambulatory Visit: Payer: 59 | Admitting: Cardiology

## 2017-10-21 ENCOUNTER — Ambulatory Visit: Payer: 59 | Admitting: Cardiology

## 2017-11-26 NOTE — Progress Notes (Signed)
Cardiology Office Note   Date:  11/27/2017   ID:  Jason Braun, DOB 04-17-1965, MRN [id number]  PCP:  Harlene Salts, PA-C  Cardiologist:   Minus Breeding, MD    Chief Complaint  Patient presents with  . Back Pain      History of Present Illness: Jason Braun is a 52 y.o. male who presents for evaluation of CAD.  He had stenting in 2004.   He had a perfusion study in 2014.  This was negative for evidence of ischemia.  I last saw him in 2018.  He returns now says he is been having weakness and tiredness.  He is been getting some vague discomfort in the middle of his back.  He is not quite sure that this is similar to prior angina but he had vague symptoms before as well.  He has a physical job and finds it is harder to do.  He has excessive diaphoresis with activity.  He may have some shortness of breath with activity.  He is not describing classic substernal chest pressure, neck or arm discomfort.  Is not having any PND or orthopnea.  He denies any palpitations, presyncope or syncope.  He has had some slight weight gain.  Past Medical History:  Diagnosis Date  . Allergy   . Anxiety   . Asthma    has albuteral inhaler - rarely uses  . CAD (coronary artery disease)    80-90% right coronary artery lesion treated with Taxus stenting 2004. 25% LAD stenosis, 30% circumflex stenosis.  Marland Kitchen GERD (gastroesophageal reflux disease)   . Hyperlipidemia   . Hypertension     Past Surgical History:  Procedure Laterality Date  . CORONARY STENT PLACEMENT  02/20/2003  . INGUINAL HERNIA REPAIR     as a child  . UPPER GASTROINTESTINAL ENDOSCOPY       Current Outpatient Medications  Medication Sig Dispense Refill  . albuterol (VENTOLIN HFA) 108 (90 BASE) MCG/ACT inhaler Inhale 1-2 puffs into the lungs every 6 (six) hours as needed for wheezing or shortness of breath. 18 g 3  . ALPRAZolam (XANAX) 0.5 MG tablet Take 1 tablet (0.5 mg total) by mouth at bedtime as needed  for sleep. 30 tablet 0  . aspirin 81 MG tablet Take 81 mg by mouth daily.    Marland Kitchen guaiFENesin (MUCINEX) 600 MG 12 hr tablet Take by mouth 2 (two) times daily.    . metoprolol tartrate (LOPRESSOR) 25 MG tablet Take 1 tablet (25 mg total) by mouth 2 (two) times daily. 180 tablet 3  . nitroGLYCERIN (NITROSTAT) 0.4 MG SL tablet Place 1 tablet (0.4 mg total) under the tongue every 5 (five) minutes as needed for chest pain. 25 tablet 0  . omeprazole (PRILOSEC) 40 MG capsule Take 40 mg by mouth daily.    . rosuvastatin (CRESTOR) 20 MG tablet Take 1 tablet (20 mg total) by mouth daily. Keep OV 30 tablet 2   Current Facility-Administered Medications  Medication Dose Route Frequency Provider Last Rate Last Dose  . 0.9 %  sodium chloride infusion  500 mL Intravenous Continuous Ladene Artist, MD        Allergies:   Lisinopril; Plavix [clopidogrel bisulfate]; Trilipix [choline fenofibrate]; and Penicillins    ROS:  Please see the history of present illness.   Otherwise, review of systems are positive for none.   All other systems are reviewed and negative.    PHYSICAL EXAM: VS:  BP 140/86 (BP  Location: Left Arm, Patient Position: Sitting, Cuff Size: Normal)   Pulse 66   Ht 5\' 7"  (1.702 m)   Wt 205 lb (93 kg)   BMI 32.11 kg/m  , BMI Body mass index is 32.11 kg/m. GENERAL:  Well appearing NECK:  No jugular venous distention, waveform within normal limits, carotid upstroke brisk and symmetric, no bruits, no thyromegaly LUNGS:  Clear to auscultation bilaterally CHEST:  Unremarkable HEART:  PMI not displaced or sustained,S1 and S2 within normal limits, no S3, no S4, no clicks, no rubs, no murmurs ABD:  Flat, positive bowel sounds normal in frequency in pitch, no bruits, no rebound, no guarding, no midline pulsatile mass, no hepatomegaly, no splenomegaly EXT:  2 plus pulses throughout, no edema, no cyanosis no clubbing   EKG:  EKG is not ordered today. The ekg ordered today demonstrates sinus  rhythm, rate 66, axis within normal limits, incomplete right bundle branch block, no acute ST-T wave changes.   Recent Labs: No results found for requested labs within last 8760 hours.      Wt Readings from Last 3 Encounters:  11/27/17 205 lb (93 kg)  03/05/17 215 lb (97.5 kg)  06/20/16 211 lb (95.7 kg)      Other studies Reviewed: Additional studies/ records that were reviewed today include: NA Review of the above records demonstrates:    ASSESSMENT AND PLAN:   CAD: The patient has recurrent vague symptoms.  He had these in the past as well.  He needs stress testing.  However, he failed stress testing in the past when he tried walking on a treadmill.  He was unable to complete a treadmill.  He had trouble nearly falling off of it.  He had trouble getting his heart rate to target.  Therefore, I am going to order [employer].  HTN:    His blood pressure is elevated.  However, he has not tolerated med titration previously.  Therefore, I am going to work with him on weight loss as he is near his target goals.   DYSLIPIDEMIA:   His LDL was 67 with an HDL of 37.  He will continue the meds as listed.  Current medicines are reviewed at length with the patient today.  The patient does not have concerns regarding medicines.  The following changes have been made:  None  Labs/ tests ordered today include:  None  Orders Placed This Encounter  Procedures  . MYOCARDIAL PERFUSION IMAGING  . EKG 12-Lead     Disposition:   FU with me in 12 months.     Signed, Minus Breeding, MD  11/27/2017 5:21 PM    [place]

## 2017-11-27 ENCOUNTER — Ambulatory Visit: Payer: 59 | Admitting: Cardiology

## 2017-11-27 ENCOUNTER — Encounter: Payer: Self-pay | Admitting: Cardiology

## 2017-11-27 VITALS — BP 140/86 | HR 66 | Ht 67.0 in | Wt 205.0 lb

## 2017-11-27 DIAGNOSIS — M549 Dorsalgia, unspecified: Secondary | ICD-10-CM

## 2017-11-27 DIAGNOSIS — I25810 Atherosclerosis of coronary artery bypass graft(s) without angina pectoris: Secondary | ICD-10-CM

## 2017-11-27 DIAGNOSIS — I517 Cardiomegaly: Secondary | ICD-10-CM

## 2017-11-27 NOTE — Patient Instructions (Signed)
Medication Instructions:  Continue current medications  If you need a refill on your cardiac medications before your next appointment, please call your pharmacy.  Labwork: None Ordered  Testing/Procedures: Your physician has requested that you have a lexiscan myoview. For further information please visit [email or web address]. Please follow instruction sheet, as given.   Follow-Up: Your physician wants you to follow-up in: 1 Year. You should receive a reminder letter in the mail two months in advance. If you do not receive a letter, please call our office in [phone] to schedule your follow-up appointment.     Thank you for choosing [facility] at [facility]!!

## 2017-11-28 ENCOUNTER — Other Ambulatory Visit: Payer: Self-pay | Admitting: Cardiology

## 2017-12-22 ENCOUNTER — Telehealth (HOSPITAL_COMMUNITY): Payer: Self-pay

## 2017-12-22 NOTE — Telephone Encounter (Signed)
Encounter complete. 

## 2017-12-23 ENCOUNTER — Telehealth (HOSPITAL_COMMUNITY): Payer: Self-pay

## 2017-12-23 NOTE — Telephone Encounter (Signed)
Encounter complete. 

## 2017-12-24 ENCOUNTER — Ambulatory Visit (HOSPITAL_COMMUNITY)
Admission: RE | Admit: 2017-12-24 | Discharge: 2017-12-24 | Disposition: A | Discharge status: Home / Self Care | Payer: 59 | Source: Ambulatory Visit | Attending: Cardiovascular Disease | Admitting: Cardiovascular Disease

## 2017-12-24 DIAGNOSIS — I25810 Atherosclerosis of coronary artery bypass graft(s) without angina pectoris: Secondary | ICD-10-CM | POA: Diagnosis not present

## 2017-12-24 LAB — MYOCARDIAL PERFUSION IMAGING
CHL CUP NUCLEAR SSS: 7
CSEPPHR: 104 {beats}/min
LV sys vol: 56 mL
LVDIAVOL: 123 mL (ref 62–150)
Rest HR: 53 {beats}/min
SDS: 4
SRS: 3
TID: 1.15

## 2017-12-24 MED ORDER — REGADENOSON 0.4 MG/5ML IV SOLN
0.4000 mg | Freq: Once | INTRAVENOUS | Status: AC
  Administered 2017-12-24: 0.4 mg via INTRAVENOUS

## 2017-12-24 MED ORDER — TECHNETIUM TC 99M TETROFOSMIN IV KIT
10.7000 | PACK | Freq: Once | INTRAVENOUS | Status: AC | PRN
  Administered 2017-12-24: 10.7 via INTRAVENOUS
  Filled 2017-12-24: qty 11

## 2017-12-24 MED ORDER — TECHNETIUM TC 99M TETROFOSMIN IV KIT
31.0000 | PACK | Freq: Once | INTRAVENOUS | Status: AC | PRN
  Administered 2017-12-24: 31 via INTRAVENOUS
  Filled 2017-12-24: qty 31

## 2018-08-23 ENCOUNTER — Other Ambulatory Visit: Payer: Self-pay | Admitting: Family Medicine

## 2018-08-23 DIAGNOSIS — F411 Generalized anxiety disorder: Secondary | ICD-10-CM

## 2018-08-23 NOTE — Telephone Encounter (Signed)
Not a patient at our office, [facility].

## 2018-09-28 ENCOUNTER — Other Ambulatory Visit: Payer: Self-pay | Admitting: *Deleted

## 2018-09-28 MED ORDER — ROSUVASTATIN CALCIUM 20 MG PO TABS: 20.0000 mg | ORAL_TABLET | Freq: Every day | ORAL | 0 refills | Status: DC

## 2018-12-29 ENCOUNTER — Ambulatory Visit: Payer: 59 | Admitting: Cardiology

## 2019-03-08 DIAGNOSIS — E785 Hyperlipidemia, unspecified: Secondary | ICD-10-CM | POA: Insufficient documentation

## 2019-03-08 DIAGNOSIS — Z7189 Other specified counseling: Secondary | ICD-10-CM | POA: Insufficient documentation

## 2019-03-08 DIAGNOSIS — I2510 Atherosclerotic heart disease of native coronary artery without angina pectoris: Secondary | ICD-10-CM | POA: Insufficient documentation

## 2019-03-08 NOTE — Progress Notes (Signed)
Cardiology Office Note   Date:  03/09/2019   ID:  Jason Braun, DOB 1965/04/09, MRN [id number]  PCP:  Harlene Salts, PA-C  Cardiologist:   Jason Breeding, MD    No chief complaint on file.     History of Present Illness: Jason Braun is a 53 y.o. male who presents for evaluation of CAD.  He had stenting in 2004.   He had a perfusion study in 2019.  This was negative for evidence of ischemia.  Since I last saw him he has had no new cardiovascular problems.  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 The patient denies any new symptoms such as chest discomfort, neck or arm discomfort. There has been no new shortness of breath, PND or orthopnea. There have been no reported palpitations, presyncope or syncope.    Past Medical History:  Diagnosis Date  . Allergy   . Anxiety   . Asthma    has albuteral inhaler - rarely uses  . CAD (coronary artery disease)    80-90% right coronary artery lesion treated with Taxus stenting 2004. 25% LAD stenosis, 30% circumflex stenosis.  Marland Kitchen GERD (gastroesophageal reflux disease)   . Hyperlipidemia   . Hypertension     Past Surgical History:  Procedure Laterality Date  . CORONARY STENT PLACEMENT  02/20/2003  . INGUINAL HERNIA REPAIR     as a child  . UPPER GASTROINTESTINAL ENDOSCOPY       Current Outpatient Medications  Medication Sig Dispense Refill  . albuterol (VENTOLIN HFA) 108 (90 BASE) MCG/ACT inhaler Inhale 1-2 puffs into the lungs every 6 (six) hours as needed for wheezing or shortness of breath. 18 g 3  . ALPRAZolam (XANAX) 0.5 MG tablet Take 1 tablet (0.5 mg total) by mouth at bedtime as needed for sleep. 30 tablet 0  . aspirin 81 MG tablet Take 81 mg by mouth daily.    Marland Kitchen guaiFENesin (MUCINEX) 600 MG 12 hr tablet Take by mouth 2 (two) times daily.    . metoprolol tartrate (LOPRESSOR) 25 MG tablet Take 1 tablet (25 mg total) by mouth 2 (two) times daily. 180 tablet 3  . nitroGLYCERIN (NITROSTAT) 0.4 MG SL tablet Place 1  tablet (0.4 mg total) under the tongue every 5 (five) minutes as needed for chest pain. 25 tablet 0  . pantoprazole (PROTONIX) 40 MG tablet Take 40 mg by mouth daily as needed.    . rosuvastatin (CRESTOR) 20 MG tablet Take 1 tablet (20 mg total) by mouth daily. Keep OV 90 tablet 0   Current Facility-Administered Medications  Medication Dose Route Frequency Provider Last Rate Last Dose  . 0.9 %  sodium chloride infusion  500 mL Intravenous Continuous Ladene Artist, MD        Allergies:   Lisinopril, Plavix [clopidogrel bisulfate], Trilipix [choline fenofibrate], and Penicillins    ROS:  Please see the history of present illness.   Otherwise, review of systems are positive for none.   All other systems are reviewed and negative.    PHYSICAL EXAM: VS:  BP 140/90   Pulse 85   Ht 5\' 7"  (1.702 m)   Wt 200 lb 12.8 oz (91.1 kg)   BMI 31.45 kg/m  , BMI Body mass index is 31.45 kg/m. GENERAL:  Well appearing NECK:  No jugular venous distention, waveform within normal limits, carotid upstroke brisk and symmetric, no bruits, no thyromegaly LUNGS:  Clear to auscultation bilaterally CHEST:  Unremarkable HEART:  PMI not displaced or  sustained,S1 and S2 within normal limits, no S3, no S4, no clicks, no rubs, no murmurs ABD:  Flat, positive bowel sounds normal in frequency in pitch, no bruits, no rebound, no guarding, no midline pulsatile mass, no hepatomegaly, no splenomegaly EXT:  2 plus pulses throughout, no edema, no cyanosis no clubbing   EKG:  EKG is  ordered today. The ekg ordered today demonstrates sinus rhythm, rate 85, axis within normal limits, incomplete right bundle branch block, no acute ST-T wave changes.   Recent Labs: No results found for requested labs within last 8760 hours.      Wt Readings from Last 3 Encounters:  03/09/19 200 lb 12.8 oz (91.1 kg)  12/24/17 205 lb (93 kg)  11/27/17 205 lb (93 kg)      Other studies Reviewed: Additional studies/ records that  were reviewed today include: Labs Review of the above records demonstrates:  NA  ASSESSMENT AND PLAN:  CAD:  The patient has no new sypmtoms.  No further cardiovascular testing is indicated.  We will continue with aggressive risk reduction and meds as listed.  HTN:    His blood pressure is not elevated.  No change in therapy.   DYSLIPIDEMIA:   LDL was 43.  Triglycerides are elevated.  We talked about dietary modifications.  Marland Kitchen  COVID EDUCATION: We talked about the vaccine and avoidance.  Current medicines are reviewed at length with the patient today.  The patient does not have concerns regarding medicines.  The following changes have been made:  None  Labs/ tests ordered today include:  None  Orders Placed This Encounter  Procedures  . EKG 12-Lead     Disposition:   FU with me in 12 months.     Signed, Jason Breeding, MD  03/09/2019 4:49 PM    [place]

## 2019-03-09 ENCOUNTER — Other Ambulatory Visit: Payer: Self-pay

## 2019-03-09 ENCOUNTER — Ambulatory Visit: Payer: 59 | Admitting: Cardiology

## 2019-03-09 ENCOUNTER — Encounter: Payer: Self-pay | Admitting: Cardiology

## 2019-03-09 VITALS — BP 140/90 | HR 85 | Ht 67.0 in | Wt 200.8 lb

## 2019-03-09 DIAGNOSIS — I10 Essential (primary) hypertension: Secondary | ICD-10-CM | POA: Diagnosis not present

## 2019-03-09 DIAGNOSIS — Z7189 Other specified counseling: Secondary | ICD-10-CM

## 2019-03-09 DIAGNOSIS — I2510 Atherosclerotic heart disease of native coronary artery without angina pectoris: Secondary | ICD-10-CM

## 2019-03-09 DIAGNOSIS — E785 Hyperlipidemia, unspecified: Secondary | ICD-10-CM

## 2019-03-09 NOTE — Patient Instructions (Signed)
Medication Instructions:  The current medical regimen is effective;  continue present plan and medications.  *If you need a refill on your cardiac medications before your next appointment, please call your pharmacy*  Follow-Up: At [facility], you and your health needs are our priority.  As part of our continuing mission to provide you with exceptional heart care, we have created designated Provider Care Teams.  These Care Teams include your primary Cardiologist (physician) and Advanced Practice Providers (APPs -  Physician Assistants and Nurse Practitioners) who all work together to provide you with the care you need, when you need it.  Your next appointment:   1 year(s)  The format for your next appointment:   In Person  Provider:   Minus Breeding, MD  Thank you for choosing [facility]!!

## 2019-05-27 ENCOUNTER — Other Ambulatory Visit: Payer: Self-pay | Admitting: Cardiology

## 2019-08-21 ENCOUNTER — Other Ambulatory Visit: Payer: Self-pay | Admitting: Cardiology

## 2020-06-05 NOTE — Progress Notes (Signed)
Cardiology Office Note   Date:  06/06/2020   ID:  Jason Braun, DOB 1965/12/03, MRN [id number]  PCP:  No primary care provider on file.  Cardiologist:   Minus Breeding, MD    Chief Complaint  Patient presents with  . Shortness of Breath      History of Present Illness: Jason Braun is a 55 y.o. male who presents for evaluation of CAD.  He had stenting in 2004.   He had a perfusion study in 2019.  This was negative for evidence of ischemia.  Since I last saw him he has had some increased dyspnea with exertion.  He has had fatigue.  He denies any new chest pressure, neck or arm discomfort.  He has had no weight gain or edema.  He says he falls asleep easily.  There was some snoring but no witnessed apnea.  He says he cannot shut his mind off at night.  He still works carrying Development worker, community   Past Medical History:  Diagnosis Date  . Allergy   . Anxiety   . Asthma    has albuteral inhaler - rarely uses  . CAD (coronary artery disease)    80-90% right coronary artery lesion treated with Taxus stenting 2004. 25% LAD stenosis, 30% circumflex stenosis.  Marland Kitchen GERD (gastroesophageal reflux disease)   . Hyperlipidemia   . Hypertension     Past Surgical History:  Procedure Laterality Date  . CORONARY STENT PLACEMENT  02/20/2003  . INGUINAL HERNIA REPAIR     as a child  . UPPER GASTROINTESTINAL ENDOSCOPY       Current Outpatient Medications  Medication Sig Dispense Refill  . albuterol (VENTOLIN HFA) 108 (90 BASE) MCG/ACT inhaler Inhale 1-2 puffs into the lungs every 6 (six) hours as needed for wheezing or shortness of breath. 18 g 3  . ALPRAZolam (XANAX) 0.5 MG tablet Take 1 tablet (0.5 mg total) by mouth at bedtime as needed for sleep. 30 tablet 0  . aspirin 81 MG tablet Take 81 mg by mouth daily.    . ergocalciferol (VITAMIN D2) 1.25 MG (50000 UT) capsule TAKE 1 CAPSULE BY MOUTH ONE TIME PER WEEK    . guaiFENesin (MUCINEX) 600 MG 12 hr tablet Take by mouth as  needed.    . metoprolol tartrate (LOPRESSOR) 25 MG tablet Take 1 tablet (25 mg total) by mouth 2 (two) times daily. 180 tablet 3  . nitroGLYCERIN (NITROSTAT) 0.4 MG SL tablet Place 1 tablet (0.4 mg total) under the tongue every 5 (five) minutes as needed for chest pain. 25 tablet 0  . pantoprazole (PROTONIX) 40 MG tablet Take 40 mg by mouth daily as needed.    . rosuvastatin (CRESTOR) 20 MG tablet TAKE 1 TABLET (20 MG TOTAL) BY MOUTH DAILY. KEEP OFFICE VISIT 90 tablet 3   Current Facility-Administered Medications  Medication Dose Route Frequency Provider Last Rate Last Admin  . 0.9 %  sodium chloride infusion  500 mL Intravenous Continuous Ladene Artist, MD        Allergies:   Lisinopril, Plavix [clopidogrel bisulfate], Trilipix [choline fenofibrate], and Penicillins    ROS:  Please see the history of present illness.   Otherwise, review of systems are positive for none.   All other systems are reviewed and negative.    PHYSICAL EXAM: VS:  BP (!) 162/100   Pulse 80   Ht 5\' 7"  (1.702 m)   Wt 217 lb (98.4 kg)   BMI 33.99 kg/m  ,  BMI Body mass index is 33.99 kg/m. GENERAL:  Well appearing NECK:  No jugular venous distention, waveform within normal limits, carotid upstroke brisk and symmetric, no bruits, no thyromegaly LUNGS:  Clear to auscultation bilaterally CHEST:  Unremarkable HEART:  PMI not displaced or sustained,S1 and S2 within normal limits, no S3, no S4, no clicks, no rubs, no murmurs ABD:  Flat, positive bowel sounds normal in frequency in pitch, no bruits, no rebound, no guarding, no midline pulsatile mass, no hepatomegaly, no splenomegaly EXT:  2 plus pulses throughout, no edema, no cyanosis no clubbing   EKG:  EKG is  ordered today. The ekg ordered today demonstrates sinus rhythm, rate 80, axis within normal limits, incomplete right bundle branch block, no acute ST-T wave changes.   Recent Labs: No results found for requested labs within last 8760 hours.       Wt Readings from Last 3 Encounters:  06/06/20 217 lb (98.4 kg)  03/09/19 200 lb 12.8 oz (91.1 kg)  12/24/17 205 lb (93 kg)      Other studies Reviewed: Additional studies/ records that were reviewed today include: Labs Review of the above records demonstrates: See elsewhere  ASSESSMENT AND PLAN:  CAD:  He has some shortness of breath.  This could be an anginal equivalent.  I would like to screen him with a POET (Plain Old Exercise Treadmill).  I will try this although he was unable to complete this a few years ago as he had trouble just walking on the treadmill but he is willing to give it a try.   HTN:    His blood pressure is elevated but he says it is always in the 338S to 505L systolic and 97Q diastolic and he has a nurse daughter x2 who checks this.   DYSLIPIDEMIA:   LDL was 77 and HDL of 22 late last year.  No change in therapy.   FATIGUE: He has significant fatigue and could have sleep apnea.  He is going to talk to his primary provider about possibly something to improve his sleep and if he does get help with that and still is fatigue I will have a low threshold for sleep study.   Current medicines are reviewed at length with the patient today.  The patient does not have concerns regarding medicines.  The following changes have been made:  None  Labs/ tests ordered today include:    Orders Placed This Encounter  Procedures  . EXERCISE TOLERANCE TEST (ETT)  . EKG 12-Lead     Disposition:   FU with me in 12 months.     Signed, Minus Breeding, MD  06/06/2020 4:47 PM    [place]

## 2020-06-06 ENCOUNTER — Ambulatory Visit: Payer: 59 | Admitting: Cardiology

## 2020-06-06 ENCOUNTER — Encounter: Payer: Self-pay | Admitting: Cardiology

## 2020-06-06 ENCOUNTER — Other Ambulatory Visit: Payer: Self-pay

## 2020-06-06 VITALS — BP 162/100 | HR 80 | Ht 67.0 in | Wt 217.0 lb

## 2020-06-06 DIAGNOSIS — I10 Essential (primary) hypertension: Secondary | ICD-10-CM

## 2020-06-06 DIAGNOSIS — I2510 Atherosclerotic heart disease of native coronary artery without angina pectoris: Secondary | ICD-10-CM

## 2020-06-06 DIAGNOSIS — E785 Hyperlipidemia, unspecified: Secondary | ICD-10-CM

## 2020-06-06 DIAGNOSIS — R0602 Shortness of breath: Secondary | ICD-10-CM

## 2020-06-06 NOTE — Patient Instructions (Addendum)
Medication Instructions:  The current medical regimen is effective;  continue present plan and medications.  *If you need a refill on your cardiac medications before your next appointment, please call your pharmacy*  Testing/Procedures: Your physician has requested that you have an exercise tolerance test.  You will be contacted to be scheduled for this testing which will be completed at [facility].  Please hold your Metoprolol the morning of your test. You may take your other normal medications this day.   No solid food 3 hours before. Covid screening will need to be scheduled as well.  You will need to self quarantine from the time of the Covid screening until the time of the stress testing.  Follow-Up: At [facility], you and your health needs are our priority.  As part of our continuing mission to provide you with exceptional heart care, we have created designated Provider Care Teams.  These Care Teams include your primary Cardiologist (physician) and Advanced Practice Providers (APPs -  Physician Assistants and Nurse Practitioners) who all work together to provide you with the care you need, when you need it.  We recommend signing up for the patient portal called "MyChart".  Sign up information is provided on this After Visit Summary.  MyChart is used to connect with patients for Virtual Visits (Telemedicine).  Patients are able to view lab/test results, encounter notes, upcoming appointments, etc.  Non-urgent messages can be sent to your provider as well.   To learn more about what you can do with MyChart, go to [email or web address].    Your next appointment:   12 month(s)  The format for your next appointment:   In Person  Provider:   Minus Breeding, MD  Thank you for choosing [facility]!!

## 2020-06-25 ENCOUNTER — Encounter (HOSPITAL_COMMUNITY): Payer: 59

## 2020-07-21 DIAGNOSIS — I2510 Atherosclerotic heart disease of native coronary artery without angina pectoris: Secondary | ICD-10-CM

## 2020-07-21 DIAGNOSIS — R0602 Shortness of breath: Secondary | ICD-10-CM

## 2020-08-19 ENCOUNTER — Other Ambulatory Visit: Payer: Self-pay | Admitting: Cardiology

## 2020-08-20 ENCOUNTER — Other Ambulatory Visit (HOSPITAL_COMMUNITY): Payer: 59

## 2020-08-21 ENCOUNTER — Telehealth: Payer: Self-pay | Admitting: *Deleted

## 2020-08-21 DIAGNOSIS — R0602 Shortness of breath: Secondary | ICD-10-CM

## 2020-08-21 DIAGNOSIS — I25709 Atherosclerosis of coronary artery bypass graft(s), unspecified, with unspecified angina pectoris: Secondary | ICD-10-CM

## 2020-08-21 NOTE — Telephone Encounter (Signed)
Minus Breeding, MD to Fidel Levy, RN      2:40 PM OK to change to [facility].   July 23, 2020  Received e-mail requesting order be placed for nuclear stress test.  From pt advice request, found the above order from Dr Percival Spanish to change pt to a [employer] at [facility].  Pt has already been scheduled.

## 2020-08-22 ENCOUNTER — Encounter (HOSPITAL_COMMUNITY): Admission: RE | Admit: 2020-08-22 | Payer: 59 | Source: Ambulatory Visit

## 2020-08-22 ENCOUNTER — Telehealth: Payer: Self-pay | Admitting: *Deleted

## 2020-08-22 ENCOUNTER — Inpatient Hospital Stay (HOSPITAL_COMMUNITY): Admission: RE | Admit: 2020-08-22 | Payer: 59 | Source: Ambulatory Visit

## 2020-08-22 NOTE — Telephone Encounter (Signed)
Received pt advice request from pt today who was scheduled to have a [place] today at [facility]. Pt and wife Venida Jarvis took the day off work and travelled for over an hour to have the testing completed.  Once pt arrived, per wife, he was told the testing had not been pre-certified with his insurance and could not be completed today unless they wanted to pay for it themselves.  In review for the chart the original order (as seen in 06/06/2020 OV note) was for a treadmill stress test.  On 4/16 pt sent a pt advice request stating he would like to have the stress test that required "the shot" because he gets SOB easily and wasn't sure he could walk on the treadmill.  This message was sent to [facility] triage and then to Dr Percival Spanish who ordered a Lexiscan Myoview.  Dr Percival Spanish sent the order to Sheral Apley, RN who notified the patient, sent a message to the scheduling team and requested Dr Percival Spanish sign the updated consent.  The actual order for the [facility] did not get placed into Epic.  On 08/21/2020, I received an email requesting an order be placed for the nuclear stress test, which I did after verifying the change in order.  After receiving the advice request I did call and speak with pt's wife Sherri (OK per [facility]) as pt is HOH on the phone.  I apologized to her for their extreme incontinences caused to both of them.  I expressed understanding the the frustration they both felt having to loose a day of work and not be able to have the testing completed.  I assured Sherri I will contact our billing/pre-cert department to make sure this has been taken care of.  Once this has been completed, they will be called to re-schedule this testing at the most convenient date and time for them.  Sherri was grateful for the follow up call and information shared.  Their home phone # as listed [phone] is the best number to contact them.

## 2020-08-27 ENCOUNTER — Other Ambulatory Visit (HOSPITAL_COMMUNITY): Payer: Self-pay | Admitting: Hematology

## 2020-09-04 ENCOUNTER — Telehealth: Payer: Self-pay | Admitting: Cardiology

## 2020-09-04 NOTE — Telephone Encounter (Signed)
Spoke with Mrs. Jason Braun regarding the rescheduling of the myoview ordered by Dr. Percival Spanish to be done at [facility]----scheduled for Wednesday 09/12/20 at 8:00 am---arrival time is 7:30 am for check in---will mail instructions to patient---informed wife this will be the "injectioin' test and it has been approved by the insurance company.  She voiced her understanding.

## 2020-09-05 NOTE — Telephone Encounter (Signed)
Pt has been scheduled for Lexiscan at [facility] on 09/12/2020.

## 2020-09-12 ENCOUNTER — Ambulatory Visit (HOSPITAL_COMMUNITY)
Admission: RE | Admit: 2020-09-12 | Discharge: 2020-09-12 | Disposition: A | Discharge status: Home / Self Care | Payer: 59 | Source: Ambulatory Visit | Attending: Cardiology | Admitting: Cardiology

## 2020-09-12 ENCOUNTER — Encounter (HOSPITAL_COMMUNITY)
Admission: RE | Admit: 2020-09-12 | Discharge: 2020-09-12 | Disposition: A | Discharge status: Home / Self Care | Payer: 59 | Source: Ambulatory Visit | Attending: Cardiology | Admitting: Cardiology

## 2020-09-12 ENCOUNTER — Encounter (HOSPITAL_COMMUNITY): Payer: Self-pay

## 2020-09-12 DIAGNOSIS — I25709 Atherosclerosis of coronary artery bypass graft(s), unspecified, with unspecified angina pectoris: Secondary | ICD-10-CM | POA: Insufficient documentation

## 2020-09-12 DIAGNOSIS — R0602 Shortness of breath: Secondary | ICD-10-CM | POA: Insufficient documentation

## 2020-09-12 LAB — NM MYOCAR MULTI W/SPECT W/WALL MOTION / EF
LV dias vol: 91 mL (ref 62–150)
LV sys vol: 32 mL
Peak HR: 106 {beats}/min
RATE: 0.42
Rest HR: 69 {beats}/min
SDS: 1
SRS: 1
SSS: 2
TID: 1.18

## 2020-09-12 MED ORDER — TECHNETIUM TC 99M TETROFOSMIN IV KIT
30.0000 | PACK | Freq: Once | INTRAVENOUS | Status: AC | PRN
  Administered 2020-09-12: 32.2 via INTRAVENOUS

## 2020-09-12 MED ORDER — SODIUM CHLORIDE FLUSH 0.9 % IV SOLN
INTRAVENOUS | Status: AC
  Administered 2020-09-12: 10 mL via INTRAVENOUS
  Filled 2020-09-12: qty 10

## 2020-09-12 MED ORDER — TECHNETIUM TC 99M TETROFOSMIN IV KIT
10.0000 | PACK | Freq: Once | INTRAVENOUS | Status: AC | PRN
  Administered 2020-09-12: 9.7 via INTRAVENOUS

## 2020-09-12 MED ORDER — REGADENOSON 0.4 MG/5ML IV SOLN
INTRAVENOUS | Status: AC
  Administered 2020-09-12: 0.4 mg via INTRAVENOUS
  Filled 2020-09-12: qty 5

## 2020-09-21 ENCOUNTER — Emergency Department (HOSPITAL_COMMUNITY): Payer: 59

## 2020-09-21 ENCOUNTER — Emergency Department (HOSPITAL_COMMUNITY)
Admission: EM | Admit: 2020-09-21 | Discharge: 2020-09-21 | Disposition: A | Discharge status: Home / Self Care | Payer: 59 | Attending: Emergency Medicine | Admitting: Emergency Medicine

## 2020-09-21 ENCOUNTER — Encounter (HOSPITAL_COMMUNITY): Payer: Self-pay

## 2020-09-21 ENCOUNTER — Other Ambulatory Visit: Payer: Self-pay

## 2020-09-21 DIAGNOSIS — J029 Acute pharyngitis, unspecified: Secondary | ICD-10-CM | POA: Insufficient documentation

## 2020-09-21 DIAGNOSIS — J449 Chronic obstructive pulmonary disease, unspecified: Secondary | ICD-10-CM | POA: Diagnosis not present

## 2020-09-21 DIAGNOSIS — Z87891 Personal history of nicotine dependence: Secondary | ICD-10-CM | POA: Insufficient documentation

## 2020-09-21 DIAGNOSIS — I10 Essential (primary) hypertension: Secondary | ICD-10-CM | POA: Diagnosis not present

## 2020-09-21 DIAGNOSIS — J45909 Unspecified asthma, uncomplicated: Secondary | ICD-10-CM | POA: Insufficient documentation

## 2020-09-21 DIAGNOSIS — I2510 Atherosclerotic heart disease of native coronary artery without angina pectoris: Secondary | ICD-10-CM | POA: Insufficient documentation

## 2020-09-21 DIAGNOSIS — Z20822 Contact with and (suspected) exposure to covid-19: Secondary | ICD-10-CM | POA: Diagnosis not present

## 2020-09-21 DIAGNOSIS — Z79899 Other long term (current) drug therapy: Secondary | ICD-10-CM | POA: Diagnosis not present

## 2020-09-21 DIAGNOSIS — Z7982 Long term (current) use of aspirin: Secondary | ICD-10-CM | POA: Diagnosis not present

## 2020-09-21 DIAGNOSIS — E876 Hypokalemia: Secondary | ICD-10-CM

## 2020-09-21 LAB — CBC WITH DIFFERENTIAL/PLATELET
Abs Immature Granulocytes: 0.02 10*3/uL (ref 0.00–0.07)
Basophils Absolute: 0.1 10*3/uL (ref 0.0–0.1)
Basophils Relative: 1 %
Eosinophils Absolute: 0.2 10*3/uL (ref 0.0–0.5)
Eosinophils Relative: 2 %
HCT: 52.2 % — ABNORMAL HIGH (ref 39.0–52.0)
Hemoglobin: 18 g/dL — ABNORMAL HIGH (ref 13.0–17.0)
Immature Granulocytes: 0 %
Lymphocytes Relative: 37 %
Lymphs Abs: 2.8 10*3/uL (ref 0.7–4.0)
MCH: 29.5 pg (ref 26.0–34.0)
MCHC: 34.5 g/dL (ref 30.0–36.0)
MCV: 85.4 fL (ref 80.0–100.0)
Monocytes Absolute: 0.7 10*3/uL (ref 0.1–1.0)
Monocytes Relative: 10 %
Neutro Abs: 3.6 10*3/uL (ref 1.7–7.7)
Neutrophils Relative %: 50 %
Platelets: 272 10*3/uL (ref 150–400)
RBC: 6.11 MIL/uL — ABNORMAL HIGH (ref 4.22–5.81)
RDW: 13.1 % (ref 11.5–15.5)
WBC: 7.4 10*3/uL (ref 4.0–10.5)
nRBC: 0 % (ref 0.0–0.2)

## 2020-09-21 LAB — COMPREHENSIVE METABOLIC PANEL
ALT: 39 U/L (ref 0–44)
AST: 35 U/L (ref 15–41)
Albumin: 4.4 g/dL (ref 3.5–5.0)
Alkaline Phosphatase: 68 U/L (ref 38–126)
Anion gap: 11 (ref 5–15)
BUN: 21 mg/dL — ABNORMAL HIGH (ref 6–20)
CO2: 28 mmol/L (ref 22–32)
Calcium: 9.6 mg/dL (ref 8.9–10.3)
Chloride: 99 mmol/L (ref 98–111)
Creatinine, Ser: 0.84 mg/dL (ref 0.61–1.24)
GFR, Estimated: >60 mL/min (ref >60)
Glucose, Bld: 119 mg/dL — ABNORMAL HIGH (ref 70–99)
Potassium: 3 mmol/L — ABNORMAL LOW (ref 3.5–5.1)
Sodium: 138 mmol/L (ref 135–145)
Total Bilirubin: 0.6 mg/dL (ref 0.3–1.2)
Total Protein: 7.7 g/dL (ref 6.5–8.1)

## 2020-09-21 LAB — RESP PANEL BY RT-PCR (FLU A&B, COVID) ARPGX2
Influenza A by PCR: NEGATIVE
Influenza B by PCR: NEGATIVE
SARS Coronavirus 2 by RT PCR: NEGATIVE

## 2020-09-21 LAB — GROUP A STREP BY PCR: Group A Strep by PCR: NOT DETECTED

## 2020-09-21 MED ORDER — SODIUM CHLORIDE 0.9 % IV SOLN: INTRAVENOUS | Status: DC

## 2020-09-21 MED ORDER — IOHEXOL 300 MG/ML  SOLN
75.0000 mL | Freq: Once | INTRAMUSCULAR | Status: AC | PRN
  Administered 2020-09-21: 75 mL via INTRAVENOUS

## 2020-09-21 MED ORDER — AZITHROMYCIN 250 MG PO TABS: 250.0000 mg | ORAL_TABLET | Freq: Every day | ORAL | 0 refills | Status: DC

## 2020-09-21 MED ORDER — SODIUM CHLORIDE 0.9 % IV BOLUS
500.0000 mL | Freq: Once | INTRAVENOUS | Status: AC
  Administered 2020-09-21: 500 mL via INTRAVENOUS

## 2020-09-21 MED ORDER — AZITHROMYCIN 250 MG PO TABS
500.0000 mg | ORAL_TABLET | Freq: Once | ORAL | Status: AC
  Administered 2020-09-21: 500 mg via ORAL
  Filled 2020-09-21: qty 2

## 2020-09-21 MED ORDER — POTASSIUM CHLORIDE CRYS ER 20 MEQ PO TBCR: 20.0000 meq | EXTENDED_RELEASE_TABLET | Freq: Two times a day (BID) | ORAL | 0 refills | Status: DC

## 2020-09-21 NOTE — ED Triage Notes (Signed)
Pt presents to ED states he feels like something is stuck in his throat since Monday. Pt states he was eating when it started but he doesn't remember what.

## 2020-09-21 NOTE — ED Provider Notes (Signed)
Emergency Medicine Provider Triage Evaluation Note  Emanual Lamountain , a 55 y.o. male  was evaluated in triage.  Pt complains of FB sensation to the throat that started 4 days ago. He is not sure what he was eating when it started but he has difficulty swallowing due to this. He has been able to tolerate solid and liquids since this started. Hx esophageal dilation.  Review of Systems  Positive: Fb sensation Negative: vomiting  Physical Exam  BP (!) 133/97 (BP Location: Right Arm)   Pulse 89   Temp 98.4 F (36.9 C) (Oral)   Resp 18   Ht 5\' 7"  (1.702 m)   Wt 95.3 kg   SpO2 93%   BMI 32.89 kg/m  Gen:   Awake, no distress   Resp:  Normal effort  MSK:   Moves extremities without difficulty  Other:  Tolerating secretions, normal phonation  Medical Decision Making  Medically screening exam initiated at 3:20 PM.  Appropriate orders placed.  [name] was informed that the remainder of the evaluation will be completed by another provider, this initial triage assessment does not replace that evaluation, and the importance of remaining in the ED until their evaluation is complete.    Bishop Dublin 09/21/20 1521    Fredia Sorrow, MD 09/21/20 2047

## 2020-09-21 NOTE — ED Notes (Signed)
Urine sample at bedside

## 2020-09-21 NOTE — ED Notes (Signed)
Patient to CT at this time

## 2020-09-21 NOTE — Discharge Instructions (Addendum)
CT scan just showed inflammation in the tonsillar pharynx area.  No evidence of any mass or any masses further down in the throat.  Take the antibiotic azithromycin as directed.  Prescription printed.  Also follow-up on MyChart your COVID testing and influenza testing results.  As well as your rapid strep test.  If it is strep pharyngitis the Zithromax should help clear that over a few days.  You can take Motrin for the throat pain.  Take the potassium supplement as directed for the next few days.  Make an appointment with your doctor to follow-up on the potassium.

## 2020-09-21 NOTE — ED Provider Notes (Signed)
[facility] EMERGENCY DEPARTMENT Provider Note   CSN: [id number] Arrival date & time: 09/21/20  1425     History Chief Complaint  Patient presents with   Foreign Body in [place] is a 55 y.o. male.  Patient does with the feeling of foreign body in his throat area since Monday.  Patient was eating when it started but he does not recall anything really getting stuck per se he has been able to swallow saliva and able to drink liquids and eat food but it gives some discomfort when he goes by that area.  It feels in the neck area does not feel in the chest area.  No other complaints.  No fever.  Past medical history significant hyperlipidemia gastroesophageal reflux disease coronary artery disease has a stent hypertension and asthma.  No history of any food impactions in the past.      Past Medical History:  Diagnosis Date   Allergy    Anxiety    Asthma    has albuteral inhaler - rarely uses   CAD (coronary artery disease)    80-90% right coronary artery lesion treated with Taxus stenting 2004. 25% LAD stenosis, 30% circumflex stenosis.   GERD (gastroesophageal reflux disease)    Hyperlipidemia    Hypertension     Patient Active Problem List   Diagnosis Date Noted   Coronary artery disease involving native coronary artery of native heart without angina pectoris 03/08/2019   Dyslipidemia 03/08/2019   Educated about COVID-19 virus infection 03/08/2019   COPD (chronic obstructive pulmonary disease) ([place]) 02/19/2015   Hyperlipidemia 02/18/2015   Dysphasia 11/11/2012   GERD (gastroesophageal reflux disease) 10/11/2012   CAD (coronary artery disease) of artery bypass graft 10/11/2012   Essential hypertension, benign 10/11/2012    Past Surgical History:  Procedure Laterality Date   CORONARY STENT PLACEMENT  02/20/2003   INGUINAL HERNIA REPAIR     as a child   UPPER GASTROINTESTINAL ENDOSCOPY         Family History  Problem Relation Age of Onset    Heart attack Father 60       Died age 37   Diabetes Maternal Grandmother    Heart disease Paternal Uncle    Kidney disease Maternal Aunt    Colon cancer Neg Hx    Esophageal cancer Neg Hx    Rectal cancer Neg Hx    Stomach cancer Neg Hx    Pancreatic cancer Neg Hx    Prostate cancer Neg Hx     Social History   Tobacco Use   Smoking status: Former    Pack years: 0.00    Types: Cigarettes    Quit date: 10/11/2001    Years since quitting: 18.9   Smokeless tobacco: Former    Types: Snuff  Substance Use Topics   Alcohol use: Yes    Comment: occasional beer/ or a shot of liquor   Drug use: No    Home Medications Prior to Admission medications   Medication Sig Start Date End Date Taking? Authorizing Provider  azithromycin (ZITHROMAX) 250 MG tablet Take 1 tablet (250 mg total) by mouth daily. Take first 2 tablets together, then 1 every day until finished. 09/21/20  Yes Fredia Sorrow, MD  albuterol (VENTOLIN HFA) 108 (90 BASE) MCG/ACT inhaler Inhale 1-2 puffs into the lungs every 6 (six) hours as needed for wheezing or shortness of breath. 02/19/15   Claretta Fraise, MD  ALPRAZolam Duanne Moron) 0.5 MG tablet Take 1  tablet (0.5 mg total) by mouth at bedtime as needed for sleep. 06/08/15   Claretta Fraise, MD  aspirin 81 MG tablet Take 81 mg by mouth daily.    [provider]  ergocalciferol (VITAMIN D2) 1.25 MG (50000 UT) capsule TAKE 1 CAPSULE BY MOUTH ONE TIME PER WEEK 03/02/20   [provider]  guaiFENesin (MUCINEX) 600 MG 12 hr tablet Take by mouth as needed.    [provider]  metoprolol tartrate (LOPRESSOR) 25 MG tablet Take 1 tablet (25 mg total) by mouth 2 (two) times daily. 02/19/15   Claretta Fraise, MD  nitroGLYCERIN (NITROSTAT) 0.4 MG SL tablet Place 1 tablet (0.4 mg total) under the tongue every 5 (five) minutes as needed for chest pain. 11/11/13   Lysbeth Penner, FNP  pantoprazole (PROTONIX) 40 MG tablet Take 40 mg by mouth daily as needed. 11/04/18    [provider]  rosuvastatin (CRESTOR) 20 MG tablet TAKE 1 TABLET (20 MG TOTAL) BY MOUTH DAILY. KEEP OFFICE VISIT 08/20/20   Minus Breeding, MD    Allergies    Lisinopril, Plavix [clopidogrel bisulfate], Trilipix [choline fenofibrate], and Penicillins  Review of Systems   Review of Systems  Constitutional:  Negative for chills and fever.  HENT:  Positive for sore throat and trouble swallowing. Negative for ear pain.   Eyes:  Negative for pain and visual disturbance.  Respiratory:  Negative for cough and shortness of breath.   Cardiovascular:  Negative for chest pain and palpitations.  Gastrointestinal:  Negative for abdominal pain and vomiting.  Genitourinary:  Negative for dysuria and hematuria.  Musculoskeletal:  Negative for arthralgias and back pain.  Skin:  Negative for color change and rash.  Neurological:  Negative for seizures and syncope.  All other systems reviewed and are negative.  Physical Exam Updated Vital Signs BP (!) 139/99   Pulse 77   Temp 98.4 F (36.9 C) (Oral)   Resp 15   Ht 1.702 m (5\' 7" )   Wt 95.3 kg   SpO2 97%   BMI 32.89 kg/m   Physical Exam Vitals and nursing note reviewed.  Constitutional:      Appearance: Normal appearance. He is well-developed.  HENT:     Head: Normocephalic and atraumatic.     Mouth/Throat:     Mouth: Mucous membranes are moist.     Pharynx: Posterior oropharyngeal erythema present. No oropharyngeal exudate.     Comments: Uvula midline.  Tonsils slightly enlarged.  No exudate.  There is erythema.  No evidence of any foreign body.  No tongue swelling Eyes:     Extraocular Movements: Extraocular movements intact.     Conjunctiva/sclera: Conjunctivae normal.     Pupils: Pupils are equal, round, and reactive to light.  Neck:     Comments: No cervical adenopathy.  Thyroid is not enlarged Cardiovascular:     Rate and Rhythm: Normal rate and regular rhythm.     Heart sounds: No murmur heard. Pulmonary:      Effort: Pulmonary effort is normal. No respiratory distress.     Breath sounds: Normal breath sounds.  Abdominal:     Palpations: Abdomen is soft.     Tenderness: There is no abdominal tenderness.  Musculoskeletal:        General: Normal range of motion.     Cervical back: Normal range of motion and neck supple. No rigidity.  Lymphadenopathy:     Cervical: No cervical adenopathy.  Skin:    General: Skin is warm and  dry.     Capillary Refill: Capillary refill takes less than 2 seconds.  Neurological:     General: No focal deficit present.     Mental Status: He is alert and oriented to person, place, and time.     Cranial Nerves: No cranial nerve deficit.     Sensory: No sensory deficit.     Motor: No weakness.    ED Results / Procedures / Treatments   Labs (all labs ordered are listed, but only abnormal results are displayed) Labs Reviewed  CBC WITH DIFFERENTIAL/PLATELET - Abnormal; Notable for the following components:      Result Value   RBC 6.11 (*)    Hemoglobin 18.0 (*)    HCT 52.2 (*)    All other components within normal limits  COMPREHENSIVE METABOLIC PANEL - Abnormal; Notable for the following components:   Potassium 3.0 (*)    Glucose, Bld 119 (*)    BUN 21 (*)    All other components within normal limits  RESP PANEL BY RT-PCR (FLU A&B, COVID) [id number]  GROUP A STREP BY PCR    EKG EKG Interpretation  Date/Time:  Friday September 21 2020 17:10:37 EDT Ventricular Rate:  79 PR Interval:  168 QRS Duration: 133 QT Interval:  408 QTC Calculation: 468 R Axis:   -34 Text Interpretation: Sinus rhythm Right bundle branch block No significant change since last tracing Confirmed by Fredia Sorrow [phone]) on 09/21/2020 5:18:44 PM  Radiology CT Soft Tissue Neck W Contrast  Result Date: 09/21/2020 CLINICAL DATA:  Neck abscess.  Difficulty swallowing. EXAM: CT NECK WITH CONTRAST TECHNIQUE: Multidetector CT imaging of the neck was performed using the standard protocol  following the bolus administration of intravenous contrast. CONTRAST:  2mL OMNIPAQUE IOHEXOL 300 MG/ML  SOLN COMPARISON:  None. FINDINGS: Pharynx and larynx: Prominent palatine tonsils noted bilaterally. Some edema within the soft palate is well. This leads to narrowing of the upper oropharynx. The airway is patent. Calcifications are present within the palatine tonsils bilaterally. No focal mucosal or submucosal lesions are present. Parapharyngeal fat is clear. No fluid collection or abscess is present. Vallecula and epiglottis are within normal limits. Aryepiglottic folds and piriform sinuses are clear. Vocal cords are midline and symmetric. Trachea is clear. Salivary glands: Parotid glands and ducts are within normal limits. Submandibular glands and ducts are unremarkable. Thyroid: A 6 mm nodule is present laterally in the right lobe of the thyroid. No other focal nodules are present. Lymph nodes: Subcentimeter lymph nodes are present in the neck bilaterally. No significant cervical adenopathy is present. Vascular: No significant vascular disease. Limited intracranial: The visualized intracranial contents are within normal limits. Visualized orbits: The globes and orbits are within normal limits. Mastoids and visualized paranasal sinuses: The paranasal sinuses and mastoid air cells are clear. Skeleton: Degenerative changes the cervical spine are most evident at C5-6 and C6-7 with osseous foraminal narrowing right greater than left at both levels. No focal lytic or blastic lesions are present. Upper chest: Lung apices are clear. Thoracic inlet is within normal limits. IMPRESSION: 1. Prominent palatine tonsils bilaterally compatible with acute pharyngitis. 2. No fluid collection or abscess. 3. Degenerative changes of the cervical spine are most evident at C5-6 and C6-7 with osseous foraminal narrowing right greater than left at both levels. Electronically Signed   By: San Morelle M.D.   On: 09/21/2020  19:26    Procedures Procedures   Medications Ordered in ED Medications  0.9 %  sodium chloride infusion ( Intravenous  New Bag/Given 09/21/20 1808)  azithromycin (ZITHROMAX) tablet 500 mg (has no administration in time range)  sodium chloride 0.9 % bolus 500 mL (0 mLs Intravenous Stopped 09/21/20 1804)  iohexol (OMNIPAQUE) 300 MG/ML solution 75 mL (75 mLs Intravenous Contrast Given 09/21/20 1816)    ED Course  I have reviewed the triage vital signs and the nursing notes.  Pertinent labs & imaging results that were available during my care of the patient were reviewed by me and considered in my medical decision making (see chart for details).    MDM Rules/Calculators/A&P                          Work-up here no leukocytosis.  Labs without any significant abnormality.  Renal functions normal.  Potassium little low at 3.0.  Patient without any vomiting or diarrhea.  Soft tissue neck just showed like a pharyngitis with some tonsil enlargement.  No other significant abnormalities.  Will test patient for strep we will do COVID and influenza testing.  Start him on a Z-Pak.  He can follow-up the results of the strep and COVID testing on MyChart.  Patient given work note.  Patient nontoxic no acute distress Final Clinical Impression(s) / ED Diagnoses Final diagnoses:  Pharyngitis, unspecified etiology    Rx / DC Orders ED Discharge Orders          Ordered    azithromycin (ZITHROMAX) 250 MG tablet  Daily        09/21/20 2035             Fredia Sorrow, MD 09/21/20 2052

## 2021-02-26 ENCOUNTER — Encounter: Payer: Self-pay | Admitting: Cardiology

## 2021-06-02 NOTE — Progress Notes (Signed)
?  ?Cardiology Office Note ? ? ?Date:  06/05/2021  ? ?ID:  Jason Braun, DOB 02/23/1966, MRN [id number] ? ?PCP:  System, Provider Not In  ?Cardiologist:   Minus Breeding, MD  ? ? ?Chief Complaint  ?Patient presents with  ? Coronary Artery Disease  ? ? ?  ?History of Present Illness: ?Jason Braun is a 56 y.o. male who presents for evaluation of CAD.  He had stenting in 2004.   He had a perfusion study in 2019 and again in 2022 when he presented with dyspnea.  He has no new complaints with his chronic shortness of breath or chest discomfort.  He is been recently started on verapamil because his blood pressures have been elevated and he does not tolerate a lot of medicines.  He has some burning skin discomfort that he has had associated with multiple of his medications but he seems be tolerating the verapamil better than others.  Unfortunately his biggest issue is he cannot swallow the pills.  Like a lot of pills to get stuck.  He said he has had this issue when he seen GI and has had stretching of his esophagus.  However, he would like a new referral because this problem is inhibiting how he eats. ? ?He is active working still carrying the mail.  He has stressed because his wife has been sick. ? ?Past Medical History:  ?Diagnosis Date  ? Allergy   ? Anxiety   ? Asthma   ? has albuteral inhaler - rarely uses  ? CAD (coronary artery disease)   ? 80-90% right coronary artery lesion treated with Taxus stenting 2004. 25% LAD stenosis, 30% circumflex stenosis.  ? GERD (gastroesophageal reflux disease)   ? Hyperlipidemia   ? Hypertension   ? ? ?Past Surgical History:  ?Procedure Laterality Date  ? CORONARY STENT PLACEMENT  02/20/2003  ? INGUINAL HERNIA REPAIR    ? as a child  ? UPPER GASTROINTESTINAL ENDOSCOPY    ? ? ? ?Current Outpatient Medications  ?Medication Sig Dispense Refill  ? albuterol (VENTOLIN HFA) 108 (90 BASE) MCG/ACT inhaler Inhale 1-2 puffs into the lungs every 6 (six) hours as needed  for wheezing or shortness of breath. 18 g 3  ? ALPRAZolam (XANAX) 0.5 MG tablet Take 1 tablet (0.5 mg total) by mouth at bedtime as needed for sleep. 30 tablet 0  ? aspirin 81 MG tablet Take 81 mg by mouth daily.    ? dexlansoprazole (DEXILANT) 60 MG capsule Take 1 capsule by mouth daily.    ? metoprolol tartrate (LOPRESSOR) 25 MG tablet Take 1 tablet (25 mg total) by mouth 2 (two) times daily. 180 tablet 3  ? nitroGLYCERIN (NITROSTAT) 0.4 MG SL tablet Place 1 tablet (0.4 mg total) under the tongue every 5 (five) minutes as needed for chest pain. 25 tablet 0  ? rosuvastatin (CRESTOR) 20 MG tablet TAKE 1 TABLET (20 MG TOTAL) BY MOUTH DAILY. KEEP OFFICE VISIT 90 tablet 3  ? verapamil (VERELAN PM) 180 MG 24 hr capsule Take 1 capsule (180 mg total) by mouth at bedtime. 90 capsule 3  ? azithromycin (ZITHROMAX) 250 MG tablet Take 1 tablet (250 mg total) by mouth daily. Take first 2 tablets together, then 1 every day until finished. (Patient not taking: Reported on 06/05/2021) 6 tablet 0  ? ergocalciferol (VITAMIN D2) 1.25 MG (50000 UT) capsule TAKE 1 CAPSULE BY MOUTH ONE TIME PER WEEK (Patient not taking: Reported on 06/05/2021)    ?  guaiFENesin (MUCINEX) 600 MG 12 hr tablet Take by mouth as needed. (Patient not taking: Reported on 06/05/2021)    ? pantoprazole (PROTONIX) 40 MG tablet Take 40 mg by mouth daily as needed.    ? potassium chloride SA (KLOR-CON) 20 MEQ tablet Take 1 tablet (20 mEq total) by mouth 2 (two) times daily. (Patient not taking: Reported on 06/05/2021) 6 tablet 0  ? ?Current Facility-Administered Medications  ?Medication Dose Route Frequency Provider Last Rate Last Admin  ? 0.9 %  sodium chloride infusion  500 mL Intravenous Continuous Ladene Artist, MD      ? ? ?Allergies:   Lisinopril, Plavix [clopidogrel bisulfate], Trilipix [choline fenofibrate], and Penicillins  ? ? ?ROS:  Please see the history of present illness.   Otherwise, review of systems are positive for none.   All other systems are reviewed  and negative.  ? ? ?PHYSICAL EXAM: ?VS:  BP 140/84   Pulse 64   Ht 5' 7.5" (1.715 m)   Wt 212 lb (96.2 kg)   BMI 32.71 kg/m?  , BMI Body mass index is 32.71 kg/m?. ?GENERAL:  Well appearing ?NECK:  No jugular venous distention, waveform within normal limits, carotid upstroke brisk and symmetric, no bruits, no thyromegaly ?LUNGS:  Clear to auscultation bilaterally ?CHEST:  Unremarkable ?HEART:  PMI not displaced or sustained,S1 and S2 within normal limits, no S3, no S4, no clicks, no rubs, no murmurs ?ABD:  Flat, positive bowel sounds normal in frequency in pitch, no bruits, no rebound, no guarding, no midline pulsatile mass, no hepatomegaly, no splenomegaly ?EXT:  2 plus pulses throughout, no edema, no cyanosis no clubbing ? ? ?EKG:  EKG is not  ordered today. ? ? ? ?Recent Labs: ?09/21/2020: ALT 39; BUN 21; Creatinine, Ser 0.84; Hemoglobin 18.0; Platelets 272; Potassium 3.0; Sodium 138  ? ? ? ? ?Wt Readings from Last 3 Encounters:  ?06/05/21 212 lb (96.2 kg)  ?09/21/20 210 lb (95.3 kg)  ?06/06/20 217 lb (98.4 kg)  ?  ? ? ?Other studies Reviewed: ?Additional studies/ records that were reviewed today include:  none  ?Review of the above records demonstrates: See elsewhere ? ?ASSESSMENT AND PLAN: ? ?CAD:   The patient has no new sypmtoms.  No further cardiovascular testing is indicated.  We will continue with aggressive risk reduction and meds as listed. ? ?HTN:    His blood pressure is still not at target.  I am going to go to 180 mg of Verelan and change this to capsules because he is having trouble swallowing the pills that he has been taking.  ? ?DYSLIPIDEMIA:   LDL was 79.  HDL 40.  No change in therapy.  ? ?DIFFICULTY SWALLOWING: I am going to refer him to gastroenterology at [facility].   ? ?Current medicines are reviewed at length with the patient today.  The patient does not have concerns regarding medicines. ? ?The following changes have been made: As above ? ?Labs/ tests ordered today include:  ? ?Orders  Placed This Encounter  ?Procedures  ? Ambulatory referral to Gastroenterology  ? ? ? ?Disposition:   FU with me in 12 months.   ? ? ?Signed, ?Minus Breeding, MD  ?06/05/2021 10:57 AM    ?[place] ?

## 2021-06-05 ENCOUNTER — Ambulatory Visit (INDEPENDENT_AMBULATORY_CARE_PROVIDER_SITE_OTHER): Payer: 59 | Admitting: Cardiology

## 2021-06-05 ENCOUNTER — Other Ambulatory Visit: Payer: Self-pay

## 2021-06-05 ENCOUNTER — Encounter: Payer: Self-pay | Admitting: Cardiology

## 2021-06-05 VITALS — BP 140/84 | HR 64 | Ht 67.5 in | Wt 212.0 lb

## 2021-06-05 DIAGNOSIS — I10 Essential (primary) hypertension: Secondary | ICD-10-CM | POA: Diagnosis not present

## 2021-06-05 DIAGNOSIS — E785 Hyperlipidemia, unspecified: Secondary | ICD-10-CM

## 2021-06-05 DIAGNOSIS — I25709 Atherosclerosis of coronary artery bypass graft(s), unspecified, with unspecified angina pectoris: Secondary | ICD-10-CM | POA: Diagnosis not present

## 2021-06-05 DIAGNOSIS — R1310 Dysphagia, unspecified: Secondary | ICD-10-CM

## 2021-06-05 MED ORDER — VERAPAMIL HCL ER 180 MG PO CP24: 180.0000 mg | ORAL_CAPSULE | Freq: Every day | ORAL | 3 refills | Status: DC

## 2021-06-05 NOTE — Patient Instructions (Addendum)
Medication Instructions:  ?Please start Verelan ER 180 mg capsule. ?Continue all other medications as listed. ? ?*If you need a refill on your cardiac medications before your next appointment, please call your pharmacy* ? ?You have been referred to [facility].  You will be contacted to be scheduled. ? ?Follow-Up: ?At [facility], you and your health needs are our priority.  As part of our continuing mission to provide you with exceptional heart care, we have created designated Provider Care Teams.  These Care Teams include your primary Cardiologist (physician) and Advanced Practice Providers (APPs -  Physician Assistants and Nurse Practitioners) who all work together to provide you with the care you need, when you need it. ? ?We recommend signing up for the patient portal called "MyChart".  Sign up information is provided on this After Visit Summary.  MyChart is used to connect with patients for Virtual Visits (Telemedicine).  Patients are able to view lab/test results, encounter notes, upcoming appointments, etc.  Non-urgent messages can be sent to your provider as well.   ?To learn more about what you can do with MyChart, go to [email or web address].   ? ?Your next appointment:   ?1 year(s) ? ?The format for your next appointment:   ?In Person ? ?Provider:   ?Minus Breeding, MD{ ? ? ?Thank you for choosing [facility]!! ? ? ? ?

## 2021-06-05 NOTE — Addendum Note (Signed)
Addended by: Shellia Cleverly on: 06/05/2021 01:02 PM ? ? Modules accepted: Orders ? ?

## 2021-06-29 ENCOUNTER — Encounter: Payer: Self-pay | Admitting: Cardiology

## 2021-07-02 MED ORDER — VALSARTAN 80 MG PO TABS: 80.0000 mg | ORAL_TABLET | Freq: Every day | ORAL | 6 refills | Status: DC

## 2021-07-03 ENCOUNTER — Encounter: Payer: Self-pay | Admitting: Cardiology

## 2021-07-09 ENCOUNTER — Encounter: Payer: Self-pay | Admitting: Cardiology

## 2021-07-09 NOTE — Telephone Encounter (Signed)
Spoke to patient he stated he feels worse taking Valsartan.Stated Valsartan is not controlling B/P like Verapamil did and he is having difficulty swallowing.Stated Verapamil helped with difficulty swallowing and controlled B/P.Stated he is stopping Valsartan.He is going to restart Verapamil 180 mg daily.Stated [facility] is refusing to see him.Advised he needs to call PCP to be referred to another GI.I will make Dr.Hochrein aware. ?

## 2021-07-11 ENCOUNTER — Other Ambulatory Visit: Payer: Self-pay

## 2022-04-18 ENCOUNTER — Other Ambulatory Visit: Payer: Self-pay | Admitting: Cardiology

## 2022-07-07 NOTE — Progress Notes (Unsigned)
  Cardiology Office Note:   Date:  07/09/2022  ID:  Jason Braun, DOB November 28, 1965, MRN [id number]  History of Present Illness:   Jason Braun is a 57 y.o. male who presents for evaluation of CAD.  He had stenting in 2004.   He had a perfusion study in 2019 and again in 2022 when he presented with dyspnea. He has chronic chest pain.    At the last visit I stopped verapamil and started valsartan.  I did this because he was having trouble swallowing the verapamil.  He called and had reflux and asked to be restarted on the verapamil.  He still having trouble swallowing.  However, he is able to do the verapamil better.  We could not get him into see a gastroenterologist for reasons unclear to me.  He had had esophageal stretching multiple times and still feels like food is getting stuck.  He denies any new chest pressure, neck or arm discomfort.  He has no new shortness of breath, PND or orthopnea.  He is working 6 days a week.  ROS: As stated in the HPI and negative for all other systems.   Studies Reviewed:    EKG: Sinus rhythm, rate 73, axis within normal limits, intervals within normal limits, no acute ST-T wave changes.    Risk Assessment/Calculations:     Physical Exam:   VS:  BP (!) 126/90   Pulse 73   Ht 5\' 7"  (1.702 m)   Wt 221 lb (100.2 kg)   BMI 34.61 kg/m    Wt Readings from Last 3 Encounters:  07/09/22 221 lb (100.2 kg)  06/05/21 212 lb (96.2 kg)  09/21/20 210 lb (95.3 kg)     GEN: Well nourished, well developed in no acute distress NECK: No JVD; No carotid bruits CARDIAC: RRR, no murmurs, rubs, gallops RESPIRATORY:  Clear to auscultation without rales, wheezing or rhonchi  ABDOMEN: Soft, non-tender, non-distended EXTREMITIES:  No edema; No deformity   ASSESSMENT AND PLAN:   CAD:   The patient has new symptoms.  No change in therapy.   HTN:    His blood pressure is at target.  No change in therapy.     DYSLIPIDEMIA:   LDL was 132 most  recently but he was not taking his statin.  He has since been taking that and it is time to repeat a lipid profile with a goal LDL of 50.    DIFFICULTY SWALLOWING:   At the last visit I referred him to GI.   I will try to make this referral again.  He is not seeing any other gastroenterologist at this point.         Signed, Minus Breeding, MD

## 2022-07-09 ENCOUNTER — Encounter: Payer: Self-pay | Admitting: Cardiology

## 2022-07-09 ENCOUNTER — Ambulatory Visit: Payer: 59 | Admitting: Cardiology

## 2022-07-09 VITALS — BP 126/90 | HR 73 | Ht 67.0 in | Wt 221.0 lb

## 2022-07-09 DIAGNOSIS — I10 Essential (primary) hypertension: Secondary | ICD-10-CM | POA: Diagnosis not present

## 2022-07-09 DIAGNOSIS — R1310 Dysphagia, unspecified: Secondary | ICD-10-CM | POA: Diagnosis not present

## 2022-07-09 DIAGNOSIS — I2510 Atherosclerotic heart disease of native coronary artery without angina pectoris: Secondary | ICD-10-CM | POA: Diagnosis not present

## 2022-07-09 DIAGNOSIS — E785 Hyperlipidemia, unspecified: Secondary | ICD-10-CM | POA: Diagnosis not present

## 2022-07-09 NOTE — Patient Instructions (Signed)
Medication Instructions:  The current medical regimen is effective;  continue present plan and medications.  *If you need a refill on your cardiac medications before your next appointment, please call your pharmacy*  Testing/Procedures: You have been referred to gastroenterology for evaluation of trouble swallowing.  You will be contacted to be scheduled.   Follow-Up: At [facility], you and your health needs are our priority.  As part of our continuing mission to provide you with exceptional heart care, we have created designated Provider Care Teams.  These Care Teams include your primary Cardiologist (physician) and Advanced Practice Providers (APPs -  Physician Assistants and Nurse Practitioners) who all work together to provide you with the care you need, when you need it.  We recommend signing up for the patient portal called "MyChart".  Sign up information is provided on this After Visit Summary.  MyChart is used to connect with patients for Virtual Visits (Telemedicine).  Patients are able to view lab/test results, encounter notes, upcoming appointments, etc.  Non-urgent messages can be sent to your provider as well.   To learn more about what you can do with MyChart, go to [email or web address].    Your next appointment:   1 year(s)  Provider:   Minus Breeding, MD

## 2022-09-13 IMAGING — CT CT NECK W/ CM
3 of 8 series · 8 of 33 positions shown, 9 images · IV contrast (Omnipaque or Isovue)
Comparison: None.

CLINICAL DATA: Neck abscess.  Difficulty swallowing.

EXAM:
CT NECK WITH CONTRAST
TECHNIQUE: Multidetector CT imaging of the neck was performed using the
standard protocol following the bolus administration of intravenous
contrast.
CONTRAST:  75mL OMNIPAQUE IOHEXOL 300 MG/ML  SOLN

[Series 2: axial neck · axial · 0.47mm/px · z∈[+167,+255]mm · 2 of 112 slices shown, 3 images]
[im 45/112  soft-tissue]
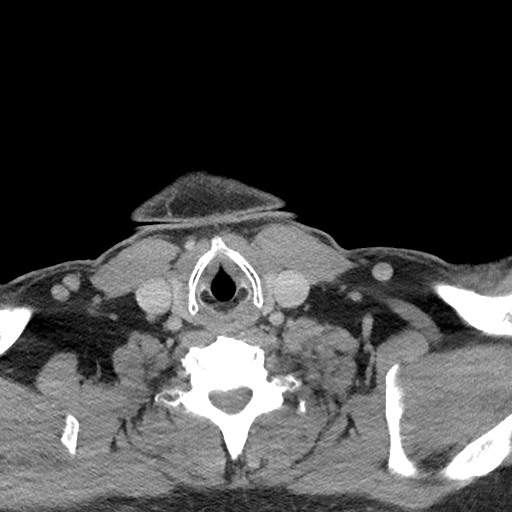
[im 45/112  bone]
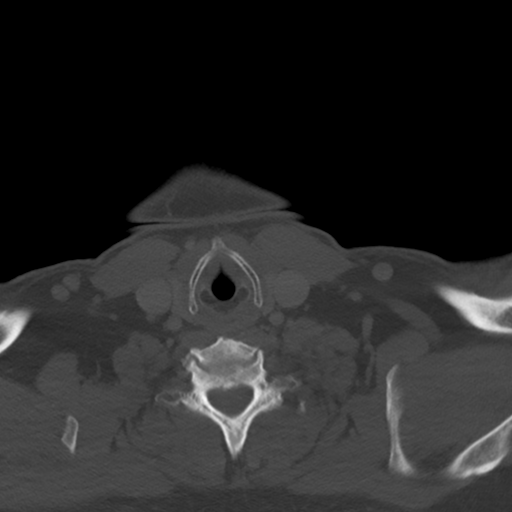
[im 89/112  bone]
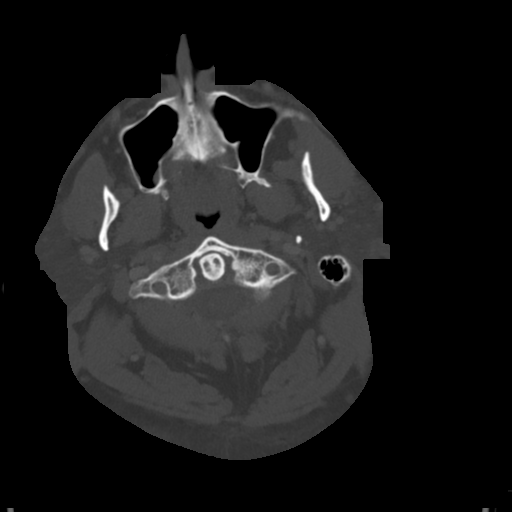

[Series 6: cor neck · coronal · 0.48mm/px · 1 of 112 slices shown]
[im 56/112  bone]
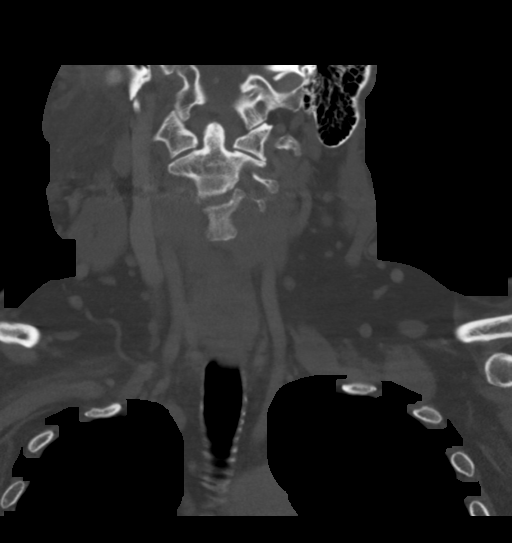

[Series 9: sag neck · sagittal · 0.19mm/px · 5 of 162 slices shown]
[im 24/162  bone]
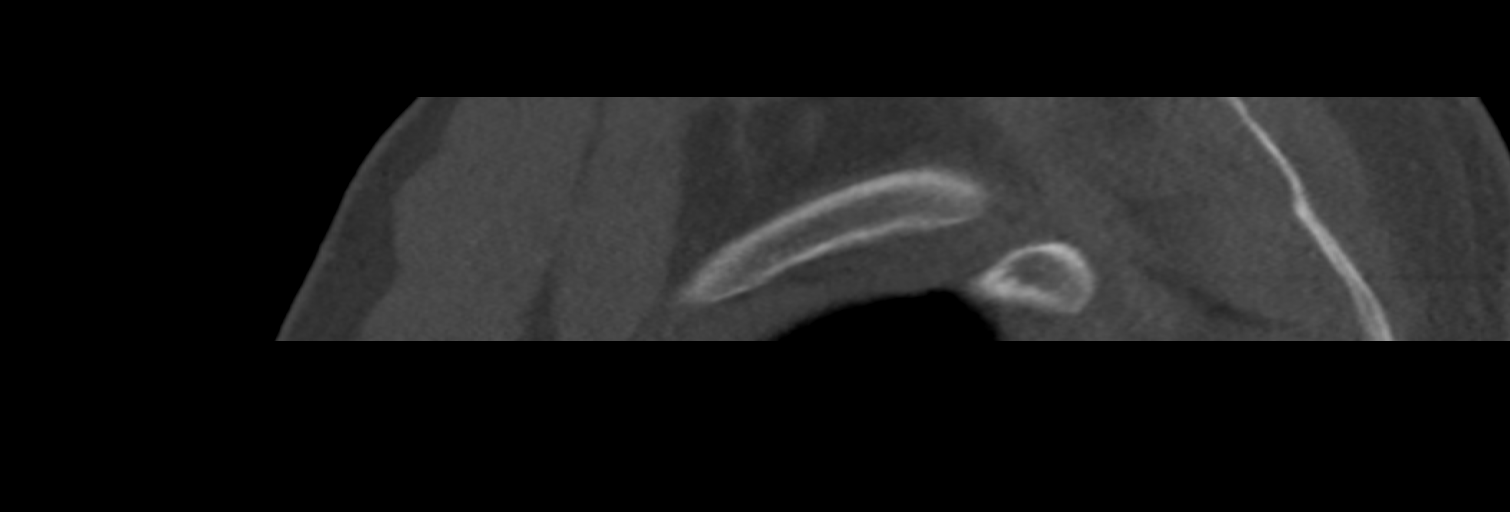
[im 47/162  bone]
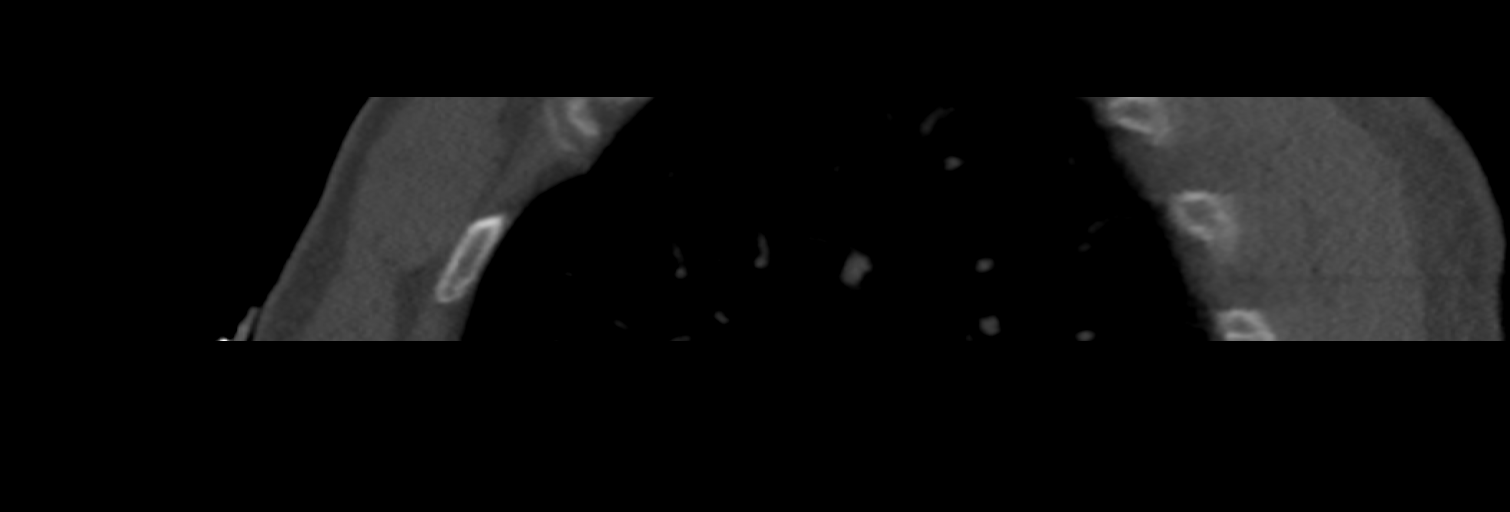
[im 70/162  bone]
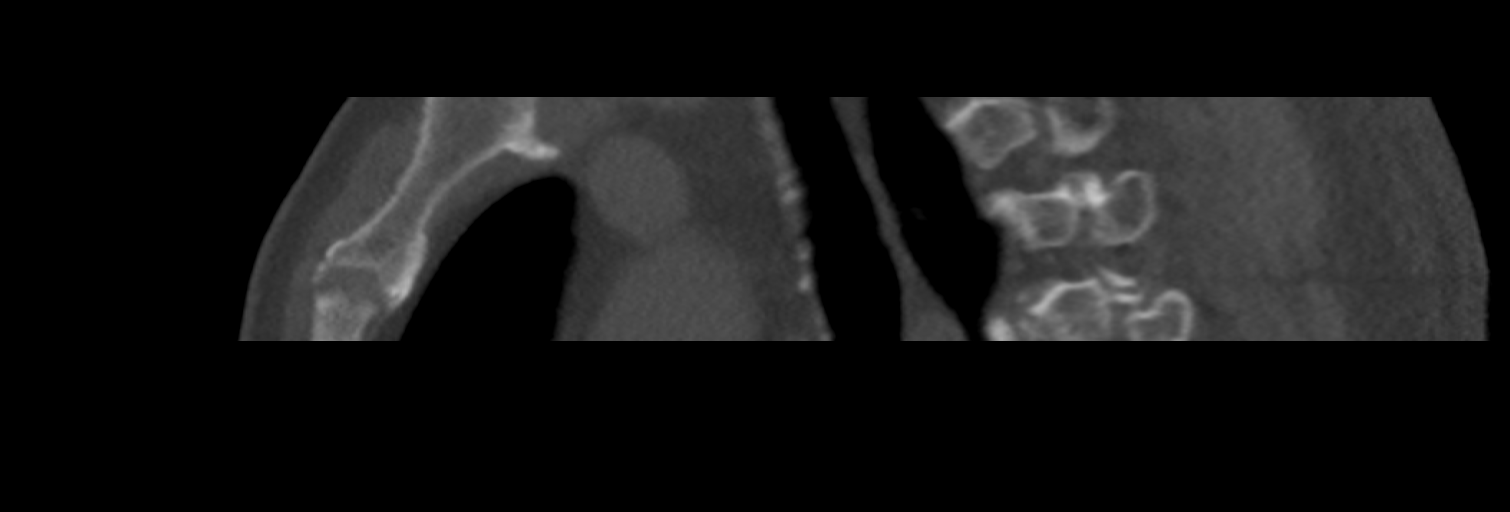
[im 93/162  bone]
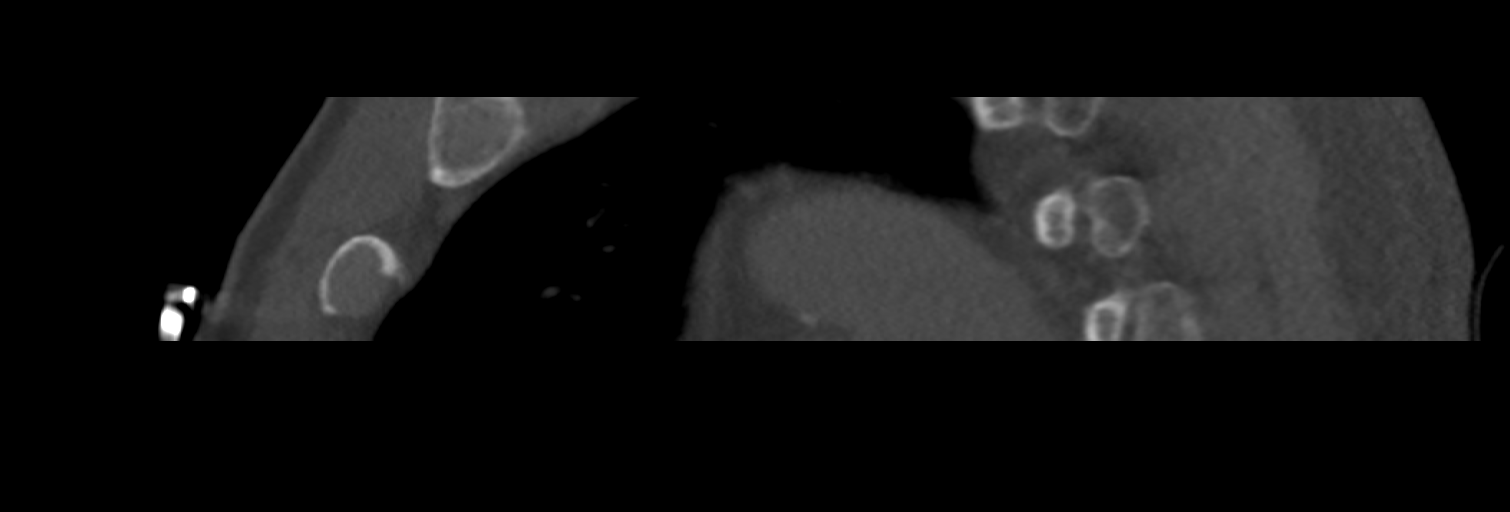
[im 116/162  bone]
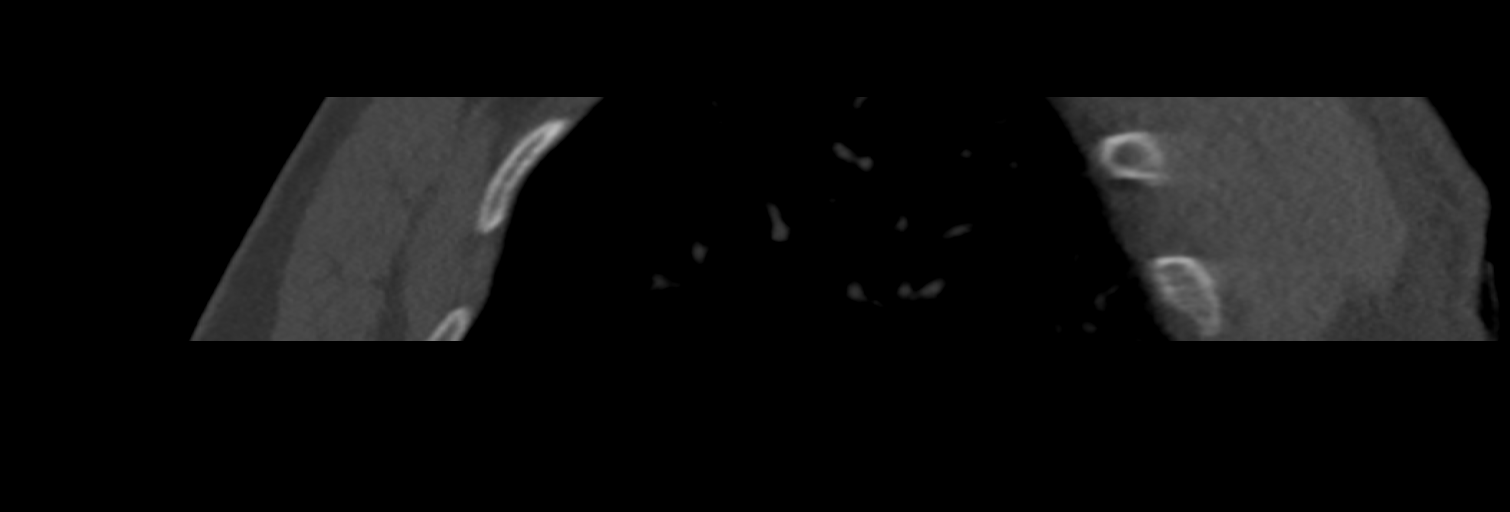

[8 of 33 positions shown; findings below may reference images not displayed]

FINDINGS: Pharynx and larynx: Prominent palatine tonsils noted bilaterally.
Some edema within the soft palate is well. This leads to narrowing
of the upper oropharynx. The airway is patent. Calcifications are
present within the palatine tonsils bilaterally. No focal mucosal or
submucosal lesions are present. Parapharyngeal fat is clear. No
fluid collection or abscess is present.

Vallecula and epiglottis are within normal limits. Aryepiglottic
folds and piriform sinuses are clear. Vocal cords are midline and
symmetric. Trachea is clear.

Salivary glands: Parotid glands and ducts are within normal limits.
Submandibular glands and ducts are unremarkable.

Thyroid: A 6 mm nodule is present laterally in the right lobe of the
thyroid. No other focal nodules are present.

Lymph nodes: Subcentimeter lymph nodes are present in the neck
bilaterally. No significant cervical adenopathy is present.

Vascular: No significant vascular disease.

Limited intracranial: The visualized intracranial contents are
within normal limits.

Visualized orbits: The globes and orbits are within normal limits.

Mastoids and visualized paranasal sinuses: The paranasal sinuses and
mastoid air cells are clear.

Skeleton: Degenerative changes the cervical spine are most evident
at C5-6 and C6-7 with osseous foraminal narrowing right greater than
left at both levels. No focal lytic or blastic lesions are present.

Upper chest: Lung apices are clear. Thoracic inlet is within normal
limits.
IMPRESSION: 1. Prominent palatine tonsils bilaterally compatible with acute
pharyngitis.
2. No fluid collection or abscess.
3. Degenerative changes of the cervical spine are most evident at
C5-6 and C6-7 with osseous foraminal narrowing right greater than
left at both levels.

## 2023-07-01 ENCOUNTER — Ambulatory Visit: Payer: 59 | Admitting: Cardiology

## 2023-08-25 ENCOUNTER — Encounter: Payer: Self-pay | Admitting: Cardiology

## 2023-09-07 NOTE — Progress Notes (Unsigned)
  Cardiology Office Note:   Date:  09/09/2023  ID:  Milferd Ansell, DOB Aug 24, 1965, MRN [id number] PCP: System, Provider Not In  [facility] Providers Cardiologist:  Eilleen Grates, MD {  History of Present Illness:   Jason Braun is a 58 y.o. male who presents for evaluation of CAD.  He had stenting in 2004.   He had a perfusion study in 2019 and again in 2022 when he presented with dyspnea.   He has chronic chest pain.    He is not having any new chest discomfort.  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  He might get fatigued with little chest discomfort but this is not changed since 2022.  He is not having any new shortness of breath, PND or orthopnea.  Not having any new palpitations, presyncope or syncope.  He denies any chest pressure, neck or arm discomfort.  He had a little weight loss but then gained some of it back.   ROS: As stated in the HPI and negative for all other systems.  Studies Reviewed:    EKG:   EKG Interpretation Date/Time:  Wednesday September 09 2023 15:53:46 EDT Ventricular Rate:  74 PR Interval:  180 QRS Duration:  114 QT Interval:  414 QTC Calculation: 459 R Axis:   -40  Text Interpretation: Normal sinus rhythm Left axis deviation When compared with ECG of 21-Sep-2020 17:10, RBBB is not present Confirmed by Eilleen Grates ([id number]) on 09/09/2023 4:27:44 PM    Risk Assessment/Calculations:       Physical Exam:   VS:  BP (!) 150/96   Pulse 74   Ht 5' 7.5" (1.715 m)   Wt 210 lb (95.3 kg)   BMI 32.41 kg/m    Wt Readings from Last 3 Encounters:  09/09/23 210 lb (95.3 kg)  07/09/22 221 lb (100.2 kg)  06/05/21 212 lb (96.2 kg)     GEN: Well nourished, well developed in no acute distress NECK: No JVD; No carotid bruits CARDIAC: RRR, no murmurs, rubs, gallops RESPIRATORY:  Clear to auscultation without rales, wheezing or rhonchi  ABDOMEN: Soft, non-tender, non-distended EXTREMITIES:  No edema; No deformity    ASSESSMENT AND PLAN:   CAD:   The patient has no new sypmtoms.  No further cardiovascular testing is indicated.  We will continue with aggressive risk reduction and meds as listed.  HTN:    His blood pressure is not at target here but he says it is controlled at home.  He is going to keep a blood pressure diary.  DYSLIPIDEMIA:   LDL was 57 with an HDL of 38.  No change in therapy.  DM: He has been diagnosed with diabetes with an A1c of 6.9 and is on meds as listed elsewhere.  We talked about diet and exercise and it might be that he would be able to come off meds if he adheres to both of these.  He is going to try that.     Follow up with me in 1 year  Signed, Eilleen Grates, MD

## 2023-09-09 ENCOUNTER — Ambulatory Visit (INDEPENDENT_AMBULATORY_CARE_PROVIDER_SITE_OTHER): Admitting: Cardiology

## 2023-09-09 ENCOUNTER — Encounter: Payer: Self-pay | Admitting: Cardiology

## 2023-09-09 VITALS — BP 150/96 | HR 74 | Ht 67.5 in | Wt 210.0 lb

## 2023-09-09 DIAGNOSIS — I25810 Atherosclerosis of coronary artery bypass graft(s) without angina pectoris: Secondary | ICD-10-CM | POA: Diagnosis not present

## 2023-09-09 DIAGNOSIS — E785 Hyperlipidemia, unspecified: Secondary | ICD-10-CM

## 2023-09-09 DIAGNOSIS — I10 Essential (primary) hypertension: Secondary | ICD-10-CM | POA: Diagnosis not present

## 2023-09-09 NOTE — Patient Instructions (Signed)
 Medication Instructions:  Your physician recommends that you continue on your current medications as directed. Please refer to the Current Medication list given to you today.  Your physician ask that you keep a blood pressure log for 2 weeks twice daily.   *If you need a refill on your cardiac medications before your next appointment, please call your pharmacy*  Lab Work: NONE   If you have labs (blood work) drawn today and your tests are completely normal, you will receive your results only by: MyChart Message (if you have MyChart) OR A paper copy in the mail If you have any lab test that is abnormal or we need to change your treatment, we will call you to review the results.  Testing/Procedures: NONE   Follow-Up: At [facility], you and your health needs are our priority.  As part of our continuing mission to provide you with exceptional heart care, our providers are all part of one team.  This team includes your primary Cardiologist (physician) and Advanced Practice Providers or APPs (Physician Assistants and Nurse Practitioners) who all work together to provide you with the care you need, when you need it.  Your next appointment:   1 year(s)  Provider:   Eilleen Grates, MD    We recommend signing up for the patient portal called "MyChart".  Sign up information is provided on this After Visit Summary.  MyChart is used to connect with patients for Virtual Visits (Telemedicine).  Patients are able to view lab/test results, encounter notes, upcoming appointments, etc.  Non-urgent messages can be sent to your provider as well.   To learn more about what you can do with MyChart, go to [email or web address].   Other Instructions Thank you for choosing [facility]!   Blood Pressure Record Sheet To take your blood pressure, you will need a blood pressure machine. You may be prescribed one, or you can buy a blood pressure machine (blood pressure monitor) at your  clinic, drug store, or online. When choosing one, look for these features: An automatic monitor that has an arm cuff. A cuff that wraps snugly, but not too tightly, around your upper arm. You should be able to fit only one finger between your arm and the cuff. A device that stores blood pressure reading results. Do not choose a monitor that measures your blood pressure from your wrist or finger. Follow your health care provider's instructions for how to take your blood pressure. To use this form: Get one reading in the morning (a.m.) before you take any medicines. Get one reading in the evening (p.m.) before supper. Take at least two readings with each blood pressure check. This makes sure the results are correct. Wait 1-2 minutes between measurements. Write down the results in the spaces on this form. Repeat this once a week, or as told by your health care provider. Make a follow-up appointment with your health care provider to discuss the results. Blood pressure log Date: _______________________ a.m. _____________________(1st reading) _____________________(2nd reading) p.m. _____________________(1st reading) _____________________(2nd reading) Date: _______________________ a.m. _____________________(1st reading) _____________________(2nd reading) p.m. _____________________(1st reading) _____________________(2nd reading) Date: _______________________ a.m. _____________________(1st reading) _____________________(2nd reading) p.m. _____________________(1st reading) _____________________(2nd reading) Date: _______________________ a.m. _____________________(1st reading) _____________________(2nd reading) p.m. _____________________(1st reading) _____________________(2nd reading) Date: _______________________ a.m. _____________________(1st reading) _____________________(2nd reading) p.m. _____________________(1st reading) _____________________(2nd reading) This information is not intended to  replace advice given to you by your health care provider. Make sure you discuss any questions you have with your health care provider. Document Revised:  12/06/2020 Document Reviewed: 12/06/2020 Elsevier Patient Education  2024 [employer].

## 2023-10-20 ENCOUNTER — Encounter: Payer: Self-pay | Admitting: Cardiology

## 2024-09-21 ENCOUNTER — Ambulatory Visit: Admitting: Cardiology
